# Patient Record
Sex: Male | Born: 1955
Health system: Southern US, Community
[De-identification: ages and names within clinical notes are randomized; demographics above are authoritative.]

## PROBLEM LIST (undated history)

## (undated) HISTORY — PX: VASECTOMY: SHX75

## (undated) HISTORY — PX: INGUINAL HERNIA REPAIR: SUR1180

---

## 2010-05-30 ENCOUNTER — Ambulatory Visit: Payer: Self-pay | Admitting: Family Medicine

## 2011-06-08 IMAGING — CR DG LUMBAR SPINE COMPLETE 4+V
1 series · 5 of 5 positions shown · non-contrast
Comparison: none

REASON FOR EXAM: back pain
COMMENTS:

[Series 1: view not recorded · 0.17mm/px · 5 of 5 slices shown]
[im 1/5]
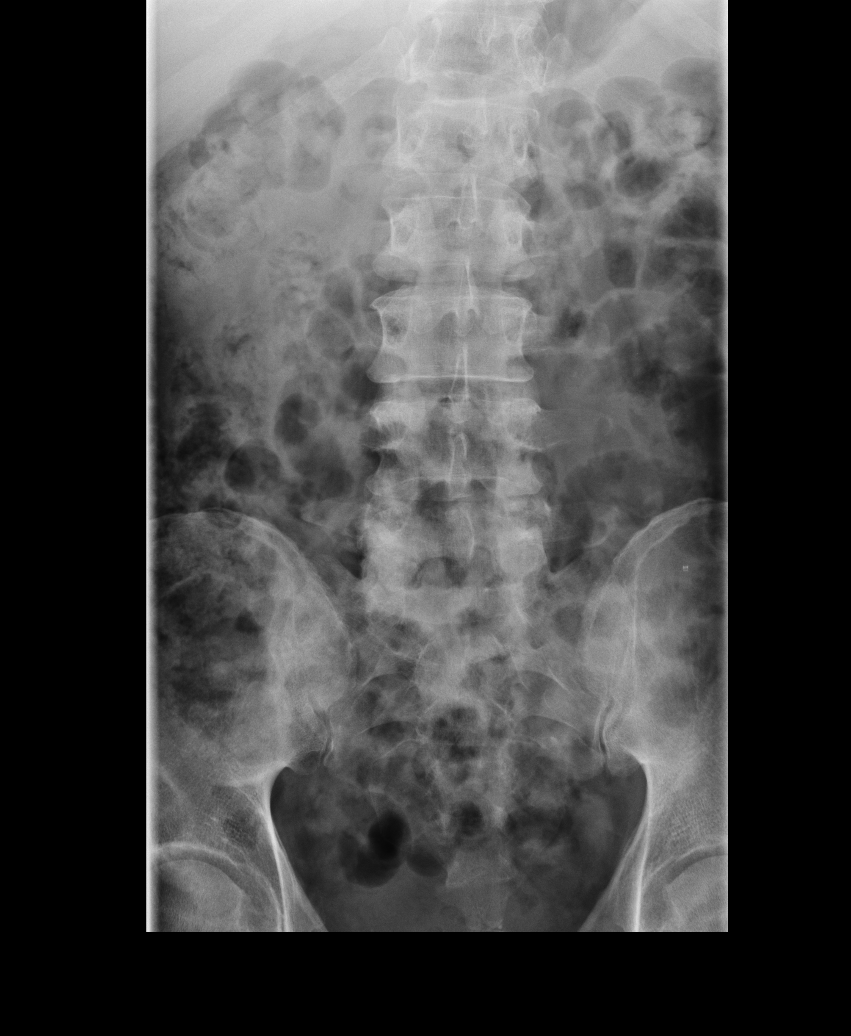
[im 2/5]
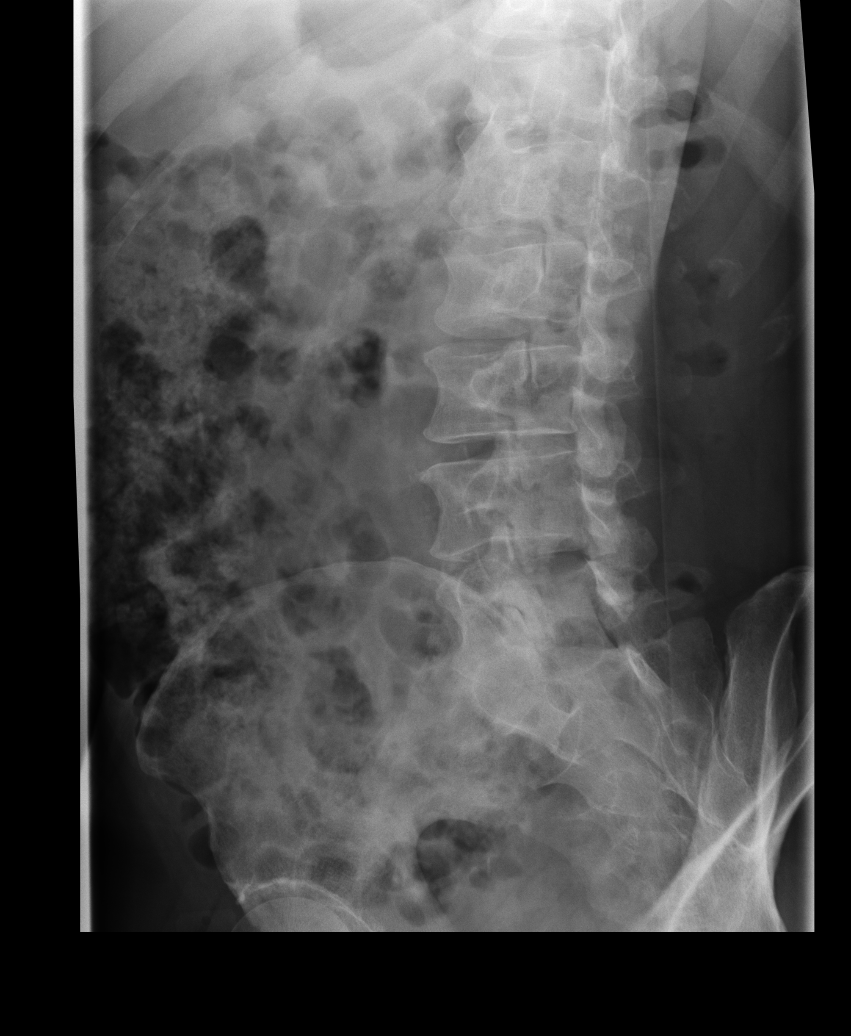
[im 3/5]
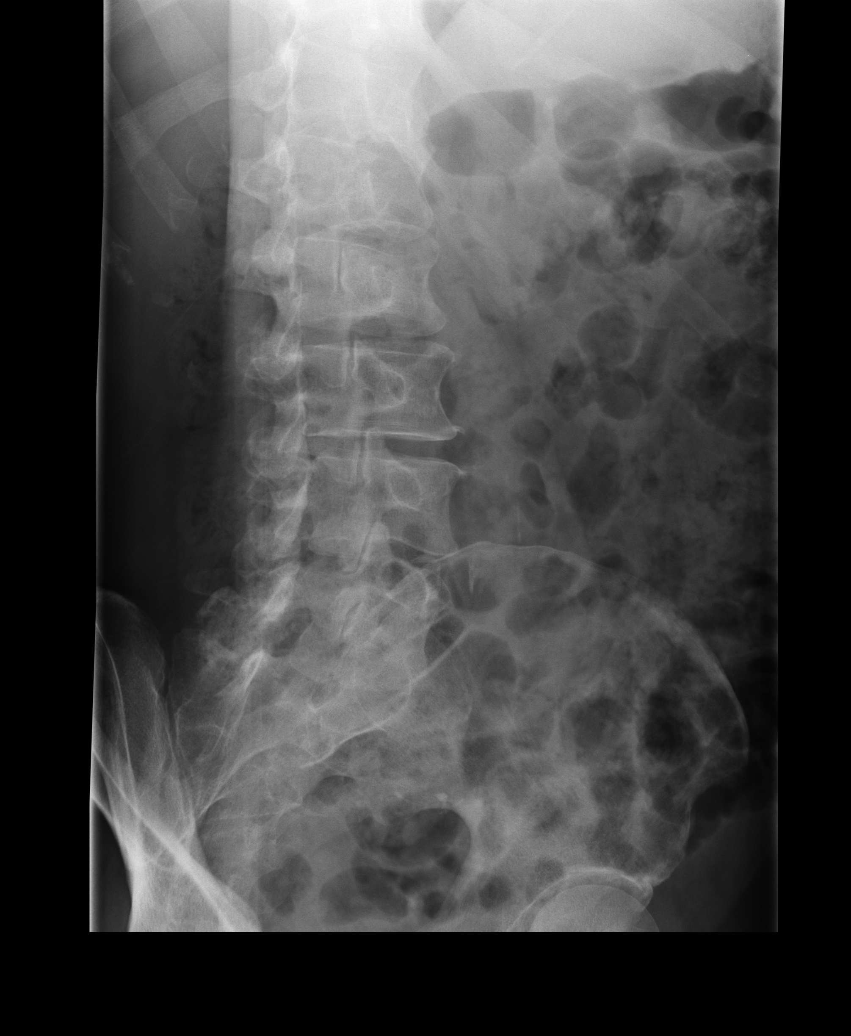
[im 4/5]
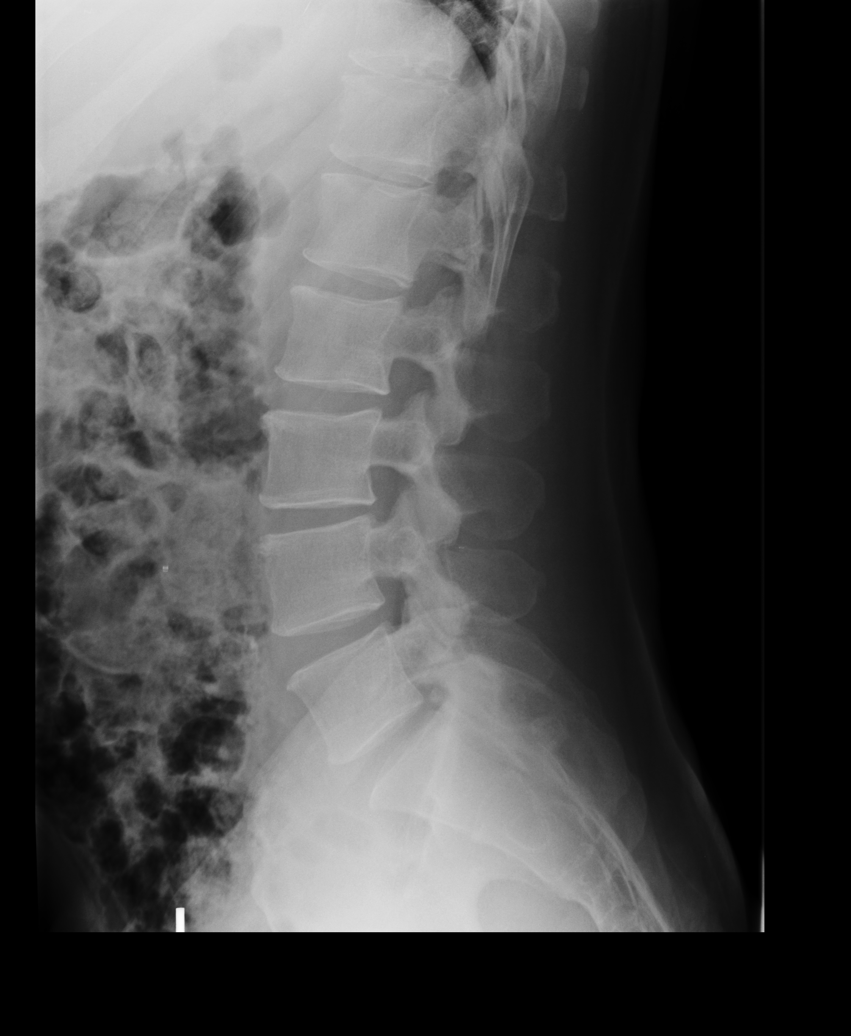
[im 5/5]
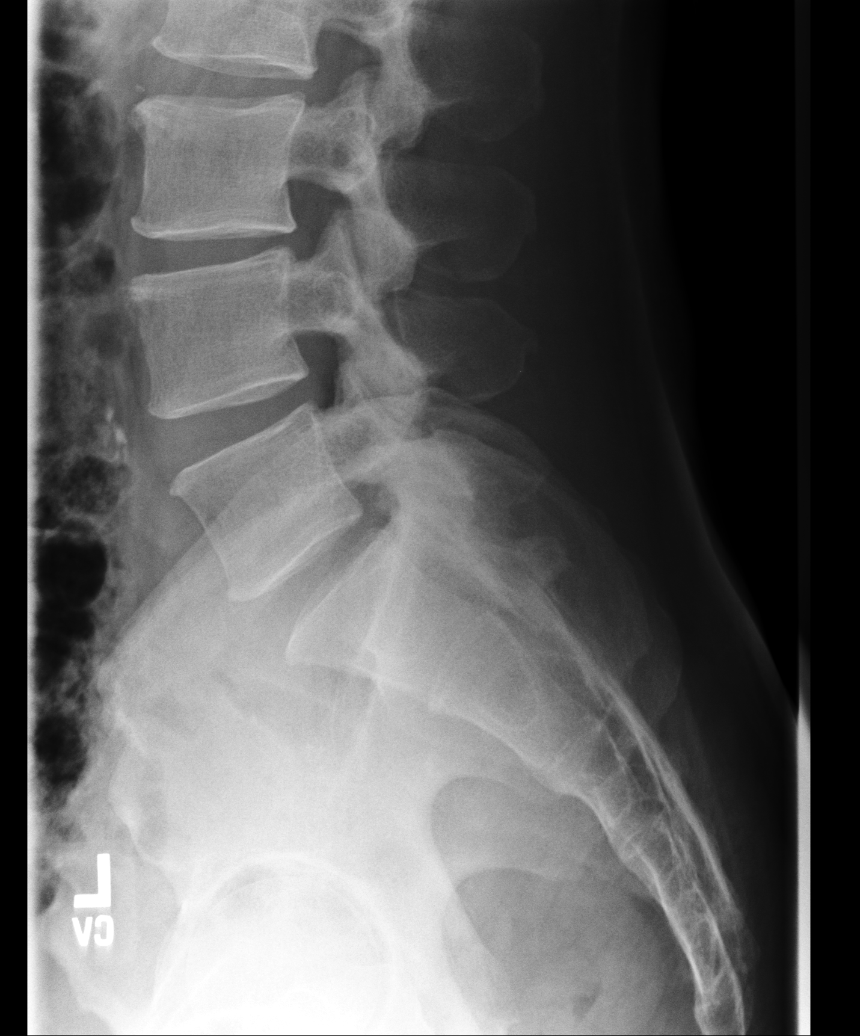

[5 of 5 positions shown; findings below may reference images not displayed]

PROCEDURE:     KDR - KDXR LUMBAR SPINE WITH OBLIQUES  - May 30, 2010  [DATE]

RESULT:     Images of the lumbar spine no demonstrate facet hypertrophy at
the L4-L5 and L5-S1 levels greater on the right than the left. The disc
spaces and vertebral body heights are maintained. There is no fracture or
subluxation. No bony destruction is evident.
IMPRESSION: Degenerative changes especially at L5-S1 on the right where
there is facet hypertrophy. Otherwise no significant finding is present. MRI
followup may be beneficial to evaluate for spinal canal stenosis or
foraminal stenosis.

## 2011-06-08 IMAGING — CR RIGHT HIP - COMPLETE 2+ VIEW
1 series · 2 of 2 positions shown · non-contrast
Comparison: none

REASON FOR EXAM: hip pain
COMMENTS:

PROCEDURE:     KDR - KDXR HIP RIGHT COMPLETE  - May 30, 2010  [DATE]
RESULT:     Images of the right hip demonstrate no evidence of fracture,
dislocation or radiopaque foreign body.

[Series 1: view not recorded · 0.17mm/px · 2 of 2 slices shown]
[im 1/2]
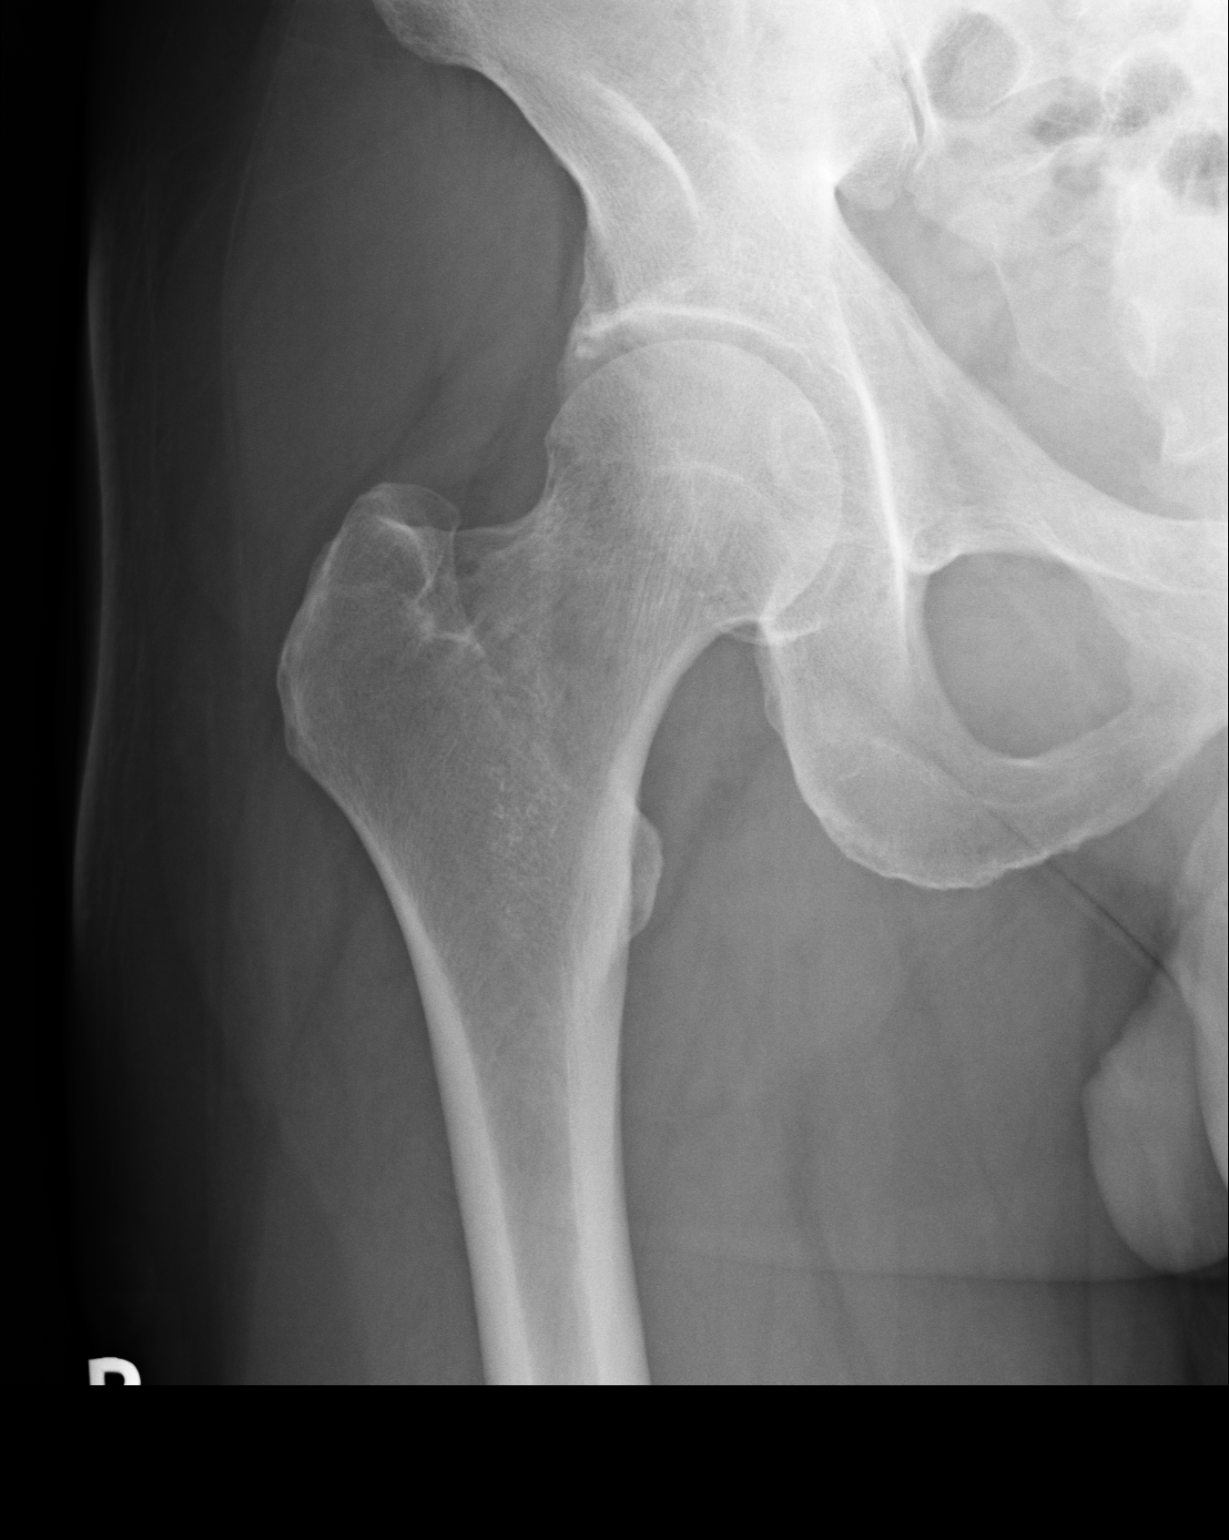
[im 2/2]
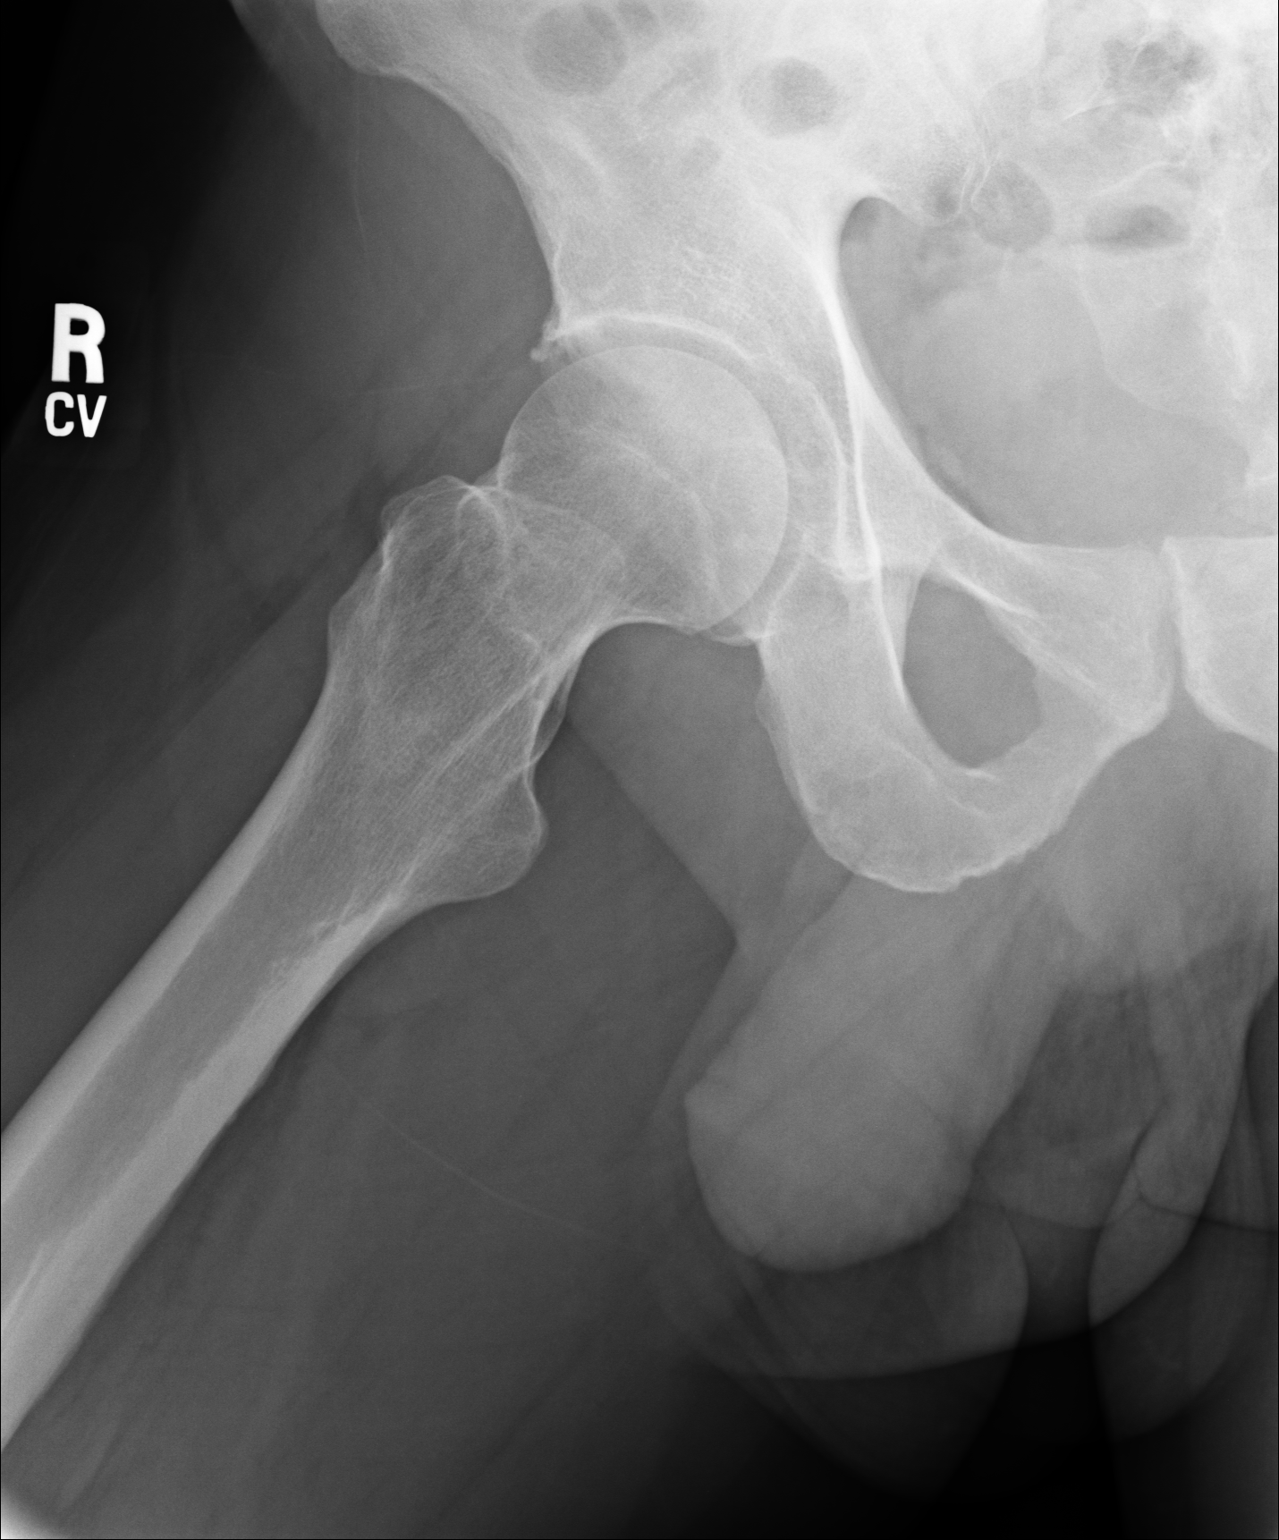

[2 of 2 positions shown; findings below may reference images not displayed]

IMPRESSION: No acute bony abnormality evident. If there is clinical
concern for occult fracture, followup with limited hip fracture protocol of
the right hip with MRI is suggested.

## 2012-05-05 ENCOUNTER — Other Ambulatory Visit: Payer: Self-pay | Admitting: Family Medicine

## 2012-05-31 ENCOUNTER — Other Ambulatory Visit: Payer: Self-pay | Admitting: Family Medicine

## 2012-05-31 NOTE — Telephone Encounter (Signed)
Please pull paper chart.  

## 2012-05-31 NOTE — Telephone Encounter (Signed)
Last seen in Jan 2007. Chart in storage.

## 2015-07-30 DIAGNOSIS — H40003 Preglaucoma, unspecified, bilateral: Secondary | ICD-10-CM | POA: Diagnosis not present

## 2016-02-11 DIAGNOSIS — H40053 Ocular hypertension, bilateral: Secondary | ICD-10-CM | POA: Diagnosis not present

## 2016-03-10 DIAGNOSIS — H409 Unspecified glaucoma: Secondary | ICD-10-CM | POA: Diagnosis not present

## 2016-03-10 DIAGNOSIS — I10 Essential (primary) hypertension: Secondary | ICD-10-CM | POA: Diagnosis not present

## 2016-03-10 DIAGNOSIS — E7800 Pure hypercholesterolemia, unspecified: Secondary | ICD-10-CM | POA: Diagnosis not present

## 2016-03-10 DIAGNOSIS — E663 Overweight: Secondary | ICD-10-CM | POA: Diagnosis not present

## 2016-05-16 DIAGNOSIS — D485 Neoplasm of uncertain behavior of skin: Secondary | ICD-10-CM | POA: Diagnosis not present

## 2016-05-16 DIAGNOSIS — L723 Sebaceous cyst: Secondary | ICD-10-CM | POA: Diagnosis not present

## 2016-05-16 DIAGNOSIS — L0889 Other specified local infections of the skin and subcutaneous tissue: Secondary | ICD-10-CM | POA: Diagnosis not present

## 2016-06-07 DIAGNOSIS — D229 Melanocytic nevi, unspecified: Secondary | ICD-10-CM | POA: Diagnosis not present

## 2016-06-07 DIAGNOSIS — D485 Neoplasm of uncertain behavior of skin: Secondary | ICD-10-CM | POA: Diagnosis not present

## 2016-07-19 DIAGNOSIS — K649 Unspecified hemorrhoids: Secondary | ICD-10-CM | POA: Diagnosis not present

## 2016-07-19 DIAGNOSIS — I10 Essential (primary) hypertension: Secondary | ICD-10-CM | POA: Diagnosis not present

## 2016-07-19 DIAGNOSIS — E7800 Pure hypercholesterolemia, unspecified: Secondary | ICD-10-CM | POA: Diagnosis not present

## 2016-07-19 DIAGNOSIS — R319 Hematuria, unspecified: Secondary | ICD-10-CM | POA: Diagnosis not present

## 2016-07-26 DIAGNOSIS — H409 Unspecified glaucoma: Secondary | ICD-10-CM | POA: Diagnosis not present

## 2016-07-26 DIAGNOSIS — E7800 Pure hypercholesterolemia, unspecified: Secondary | ICD-10-CM | POA: Diagnosis not present

## 2016-07-26 DIAGNOSIS — I10 Essential (primary) hypertension: Secondary | ICD-10-CM | POA: Diagnosis not present

## 2016-07-26 DIAGNOSIS — R319 Hematuria, unspecified: Secondary | ICD-10-CM | POA: Diagnosis not present

## 2016-07-26 DIAGNOSIS — Z1389 Encounter for screening for other disorder: Secondary | ICD-10-CM | POA: Diagnosis not present

## 2016-08-14 DIAGNOSIS — H40033 Anatomical narrow angle, bilateral: Secondary | ICD-10-CM | POA: Diagnosis not present

## 2016-08-14 DIAGNOSIS — H40053 Ocular hypertension, bilateral: Secondary | ICD-10-CM | POA: Diagnosis not present

## 2016-08-16 DIAGNOSIS — R310 Gross hematuria: Secondary | ICD-10-CM | POA: Diagnosis not present

## 2016-08-16 DIAGNOSIS — N401 Enlarged prostate with lower urinary tract symptoms: Secondary | ICD-10-CM | POA: Diagnosis not present

## 2016-08-25 DIAGNOSIS — N400 Enlarged prostate without lower urinary tract symptoms: Secondary | ICD-10-CM | POA: Diagnosis not present

## 2016-08-25 DIAGNOSIS — I700 Atherosclerosis of aorta: Secondary | ICD-10-CM | POA: Diagnosis not present

## 2016-08-25 DIAGNOSIS — R310 Gross hematuria: Secondary | ICD-10-CM | POA: Diagnosis not present

## 2016-08-30 DIAGNOSIS — N329 Bladder disorder, unspecified: Secondary | ICD-10-CM | POA: Diagnosis not present

## 2016-08-30 DIAGNOSIS — N401 Enlarged prostate with lower urinary tract symptoms: Secondary | ICD-10-CM | POA: Diagnosis not present

## 2016-08-30 DIAGNOSIS — R310 Gross hematuria: Secondary | ICD-10-CM | POA: Diagnosis not present

## 2016-10-20 DIAGNOSIS — I10 Essential (primary) hypertension: Secondary | ICD-10-CM | POA: Diagnosis not present

## 2016-10-20 DIAGNOSIS — H409 Unspecified glaucoma: Secondary | ICD-10-CM | POA: Diagnosis not present

## 2016-10-20 DIAGNOSIS — R319 Hematuria, unspecified: Secondary | ICD-10-CM | POA: Diagnosis not present

## 2016-10-20 DIAGNOSIS — E7800 Pure hypercholesterolemia, unspecified: Secondary | ICD-10-CM | POA: Diagnosis not present

## 2016-12-13 DIAGNOSIS — D485 Neoplasm of uncertain behavior of skin: Secondary | ICD-10-CM | POA: Diagnosis not present

## 2016-12-13 DIAGNOSIS — Z86018 Personal history of other benign neoplasm: Secondary | ICD-10-CM | POA: Diagnosis not present

## 2017-02-13 DIAGNOSIS — H40003 Preglaucoma, unspecified, bilateral: Secondary | ICD-10-CM | POA: Diagnosis not present

## 2017-03-19 DIAGNOSIS — R319 Hematuria, unspecified: Secondary | ICD-10-CM | POA: Diagnosis not present

## 2017-03-19 DIAGNOSIS — Z720 Tobacco use: Secondary | ICD-10-CM | POA: Diagnosis not present

## 2017-03-19 DIAGNOSIS — E7800 Pure hypercholesterolemia, unspecified: Secondary | ICD-10-CM | POA: Diagnosis not present

## 2017-03-19 DIAGNOSIS — I10 Essential (primary) hypertension: Secondary | ICD-10-CM | POA: Diagnosis not present

## 2017-05-01 DIAGNOSIS — E7800 Pure hypercholesterolemia, unspecified: Secondary | ICD-10-CM | POA: Diagnosis not present

## 2017-05-01 DIAGNOSIS — I10 Essential (primary) hypertension: Secondary | ICD-10-CM | POA: Diagnosis not present

## 2017-05-03 DIAGNOSIS — I10 Essential (primary) hypertension: Secondary | ICD-10-CM | POA: Diagnosis not present

## 2017-05-03 DIAGNOSIS — Z122 Encounter for screening for malignant neoplasm of respiratory organs: Secondary | ICD-10-CM | POA: Diagnosis not present

## 2017-05-03 DIAGNOSIS — Z0000 Encounter for general adult medical examination without abnormal findings: Secondary | ICD-10-CM | POA: Diagnosis not present

## 2017-05-03 DIAGNOSIS — Z720 Tobacco use: Secondary | ICD-10-CM | POA: Diagnosis not present

## 2017-05-03 DIAGNOSIS — Z23 Encounter for immunization: Secondary | ICD-10-CM | POA: Diagnosis not present

## 2017-06-21 DIAGNOSIS — D485 Neoplasm of uncertain behavior of skin: Secondary | ICD-10-CM | POA: Diagnosis not present

## 2017-06-21 DIAGNOSIS — Z86018 Personal history of other benign neoplasm: Secondary | ICD-10-CM | POA: Diagnosis not present

## 2017-06-21 DIAGNOSIS — L821 Other seborrheic keratosis: Secondary | ICD-10-CM | POA: Diagnosis not present

## 2017-06-21 DIAGNOSIS — L578 Other skin changes due to chronic exposure to nonionizing radiation: Secondary | ICD-10-CM | POA: Diagnosis not present

## 2017-07-17 DIAGNOSIS — D485 Neoplasm of uncertain behavior of skin: Secondary | ICD-10-CM | POA: Diagnosis not present

## 2017-07-17 DIAGNOSIS — D225 Melanocytic nevi of trunk: Secondary | ICD-10-CM | POA: Diagnosis not present

## 2017-08-16 DIAGNOSIS — H40053 Ocular hypertension, bilateral: Secondary | ICD-10-CM | POA: Diagnosis not present

## 2017-08-16 DIAGNOSIS — H02053 Trichiasis without entropian right eye, unspecified eyelid: Secondary | ICD-10-CM | POA: Diagnosis not present

## 2017-10-31 DIAGNOSIS — G4733 Obstructive sleep apnea (adult) (pediatric): Secondary | ICD-10-CM | POA: Diagnosis not present

## 2017-10-31 DIAGNOSIS — Z1331 Encounter for screening for depression: Secondary | ICD-10-CM | POA: Diagnosis not present

## 2017-10-31 DIAGNOSIS — I10 Essential (primary) hypertension: Secondary | ICD-10-CM | POA: Diagnosis not present

## 2017-10-31 DIAGNOSIS — R001 Bradycardia, unspecified: Secondary | ICD-10-CM | POA: Diagnosis not present

## 2017-11-05 DIAGNOSIS — G4730 Sleep apnea, unspecified: Secondary | ICD-10-CM | POA: Diagnosis not present

## 2017-12-19 DIAGNOSIS — L570 Actinic keratosis: Secondary | ICD-10-CM | POA: Diagnosis not present

## 2017-12-19 DIAGNOSIS — Z86018 Personal history of other benign neoplasm: Secondary | ICD-10-CM | POA: Diagnosis not present

## 2017-12-19 DIAGNOSIS — L578 Other skin changes due to chronic exposure to nonionizing radiation: Secondary | ICD-10-CM | POA: Diagnosis not present

## 2017-12-19 DIAGNOSIS — L720 Epidermal cyst: Secondary | ICD-10-CM | POA: Diagnosis not present

## 2017-12-19 DIAGNOSIS — Z1283 Encounter for screening for malignant neoplasm of skin: Secondary | ICD-10-CM | POA: Diagnosis not present

## 2018-02-12 DIAGNOSIS — H40003 Preglaucoma, unspecified, bilateral: Secondary | ICD-10-CM | POA: Diagnosis not present

## 2018-05-10 DIAGNOSIS — E7800 Pure hypercholesterolemia, unspecified: Secondary | ICD-10-CM | POA: Diagnosis not present

## 2018-05-10 DIAGNOSIS — Z6829 Body mass index (BMI) 29.0-29.9, adult: Secondary | ICD-10-CM | POA: Diagnosis not present

## 2018-05-10 DIAGNOSIS — R001 Bradycardia, unspecified: Secondary | ICD-10-CM | POA: Diagnosis not present

## 2018-05-10 DIAGNOSIS — I10 Essential (primary) hypertension: Secondary | ICD-10-CM | POA: Diagnosis not present

## 2018-11-28 DIAGNOSIS — R001 Bradycardia, unspecified: Secondary | ICD-10-CM | POA: Diagnosis not present

## 2018-11-28 DIAGNOSIS — T50905A Adverse effect of unspecified drugs, medicaments and biological substances, initial encounter: Secondary | ICD-10-CM | POA: Diagnosis not present

## 2018-11-28 DIAGNOSIS — E7800 Pure hypercholesterolemia, unspecified: Secondary | ICD-10-CM | POA: Diagnosis not present

## 2018-11-28 DIAGNOSIS — Z1331 Encounter for screening for depression: Secondary | ICD-10-CM | POA: Diagnosis not present

## 2018-11-28 DIAGNOSIS — I10 Essential (primary) hypertension: Secondary | ICD-10-CM | POA: Diagnosis not present

## 2018-12-23 DIAGNOSIS — L578 Other skin changes due to chronic exposure to nonionizing radiation: Secondary | ICD-10-CM | POA: Diagnosis not present

## 2018-12-23 DIAGNOSIS — Z86018 Personal history of other benign neoplasm: Secondary | ICD-10-CM | POA: Diagnosis not present

## 2018-12-23 DIAGNOSIS — Z872 Personal history of diseases of the skin and subcutaneous tissue: Secondary | ICD-10-CM | POA: Diagnosis not present

## 2019-03-03 DIAGNOSIS — K4090 Unilateral inguinal hernia, without obstruction or gangrene, not specified as recurrent: Secondary | ICD-10-CM | POA: Diagnosis not present

## 2019-03-03 DIAGNOSIS — Z6827 Body mass index (BMI) 27.0-27.9, adult: Secondary | ICD-10-CM | POA: Diagnosis not present

## 2019-03-13 DIAGNOSIS — K4090 Unilateral inguinal hernia, without obstruction or gangrene, not specified as recurrent: Secondary | ICD-10-CM | POA: Diagnosis not present

## 2019-03-13 DIAGNOSIS — N5089 Other specified disorders of the male genital organs: Secondary | ICD-10-CM | POA: Diagnosis not present

## 2019-03-28 DIAGNOSIS — Z01818 Encounter for other preprocedural examination: Secondary | ICD-10-CM | POA: Diagnosis not present

## 2019-03-31 DIAGNOSIS — Z9989 Dependence on other enabling machines and devices: Secondary | ICD-10-CM | POA: Diagnosis not present

## 2019-03-31 DIAGNOSIS — J302 Other seasonal allergic rhinitis: Secondary | ICD-10-CM | POA: Diagnosis not present

## 2019-03-31 DIAGNOSIS — G4733 Obstructive sleep apnea (adult) (pediatric): Secondary | ICD-10-CM | POA: Diagnosis not present

## 2019-03-31 DIAGNOSIS — K4090 Unilateral inguinal hernia, without obstruction or gangrene, not specified as recurrent: Secondary | ICD-10-CM | POA: Diagnosis not present

## 2019-03-31 DIAGNOSIS — K219 Gastro-esophageal reflux disease without esophagitis: Secondary | ICD-10-CM | POA: Diagnosis not present

## 2019-03-31 DIAGNOSIS — F17210 Nicotine dependence, cigarettes, uncomplicated: Secondary | ICD-10-CM | POA: Diagnosis not present

## 2019-03-31 DIAGNOSIS — R918 Other nonspecific abnormal finding of lung field: Secondary | ICD-10-CM | POA: Diagnosis not present

## 2019-03-31 DIAGNOSIS — Z79899 Other long term (current) drug therapy: Secondary | ICD-10-CM | POA: Diagnosis not present

## 2019-03-31 DIAGNOSIS — I10 Essential (primary) hypertension: Secondary | ICD-10-CM | POA: Diagnosis not present

## 2019-04-07 DIAGNOSIS — J189 Pneumonia, unspecified organism: Secondary | ICD-10-CM | POA: Diagnosis not present

## 2019-04-07 DIAGNOSIS — Z87891 Personal history of nicotine dependence: Secondary | ICD-10-CM | POA: Diagnosis not present

## 2019-04-07 DIAGNOSIS — J449 Chronic obstructive pulmonary disease, unspecified: Secondary | ICD-10-CM | POA: Diagnosis not present

## 2020-08-17 DIAGNOSIS — H40003 Preglaucoma, unspecified, bilateral: Secondary | ICD-10-CM | POA: Diagnosis not present

## 2020-10-08 DIAGNOSIS — Z0000 Encounter for general adult medical examination without abnormal findings: Secondary | ICD-10-CM | POA: Diagnosis not present

## 2020-10-08 DIAGNOSIS — I10 Essential (primary) hypertension: Secondary | ICD-10-CM | POA: Diagnosis not present

## 2020-10-08 DIAGNOSIS — R011 Cardiac murmur, unspecified: Secondary | ICD-10-CM | POA: Diagnosis not present

## 2020-10-08 DIAGNOSIS — Z720 Tobacco use: Secondary | ICD-10-CM | POA: Diagnosis not present

## 2020-10-08 DIAGNOSIS — E7800 Pure hypercholesterolemia, unspecified: Secondary | ICD-10-CM | POA: Diagnosis not present

## 2020-10-08 DIAGNOSIS — Z136 Encounter for screening for cardiovascular disorders: Secondary | ICD-10-CM | POA: Diagnosis not present

## 2020-10-08 DIAGNOSIS — R319 Hematuria, unspecified: Secondary | ICD-10-CM | POA: Diagnosis not present

## 2020-10-08 DIAGNOSIS — R001 Bradycardia, unspecified: Secondary | ICD-10-CM | POA: Diagnosis not present

## 2020-10-08 DIAGNOSIS — Z23 Encounter for immunization: Secondary | ICD-10-CM | POA: Diagnosis not present

## 2020-10-08 DIAGNOSIS — Z9181 History of falling: Secondary | ICD-10-CM | POA: Diagnosis not present

## 2020-10-21 ENCOUNTER — Ambulatory Visit (INDEPENDENT_AMBULATORY_CARE_PROVIDER_SITE_OTHER): Payer: Medicare Other | Admitting: Physician Assistant

## 2020-10-21 ENCOUNTER — Encounter: Payer: Self-pay | Admitting: Urology

## 2020-10-21 ENCOUNTER — Other Ambulatory Visit: Payer: Self-pay

## 2020-10-21 VITALS — BP 152/84 | HR 70 | Ht 68.0 in | Wt 177.0 lb

## 2020-10-21 DIAGNOSIS — R310 Gross hematuria: Secondary | ICD-10-CM

## 2020-10-21 DIAGNOSIS — R972 Elevated prostate specific antigen [PSA]: Secondary | ICD-10-CM

## 2020-10-21 DIAGNOSIS — R3916 Straining to void: Secondary | ICD-10-CM

## 2020-10-21 DIAGNOSIS — N401 Enlarged prostate with lower urinary tract symptoms: Secondary | ICD-10-CM | POA: Diagnosis not present

## 2020-10-21 MED ORDER — TAMSULOSIN HCL 0.4 MG PO CAPS: 0.4000 mg | ORAL_CAPSULE | Freq: Every day | ORAL | 0 refills | Status: DC

## 2020-10-21 NOTE — Patient Instructions (Signed)
Cystoscopy Cystoscopy is a procedure that is used to help diagnose and sometimes treat conditions that affect the lower urinary tract. The lower urinary tract includes the bladder and the urethra. The urethra is the tube that drains urine from the bladder. Cystoscopy is done using a thin, tube-shaped instrument with a light and camera at the end (cystoscope). The cystoscope may be hard or flexible, depending on the goal of the procedure. The cystoscope is inserted through the urethra, into the bladder. Cystoscopy may be recommended if you have: Urinary tract infections that keep coming back. Blood in the urine (hematuria). An inability to control when you urinate (urinary incontinence) or an overactive bladder. Unusual cells found in a urine sample. A blockage in the urethra, such as a urinary stone. Painful urination. An abnormality in the bladder found during an intravenous pyelogram (IVP) or CT scan. Cystoscopy may also be done to remove a sample of tissue to be examined under a microscope (biopsy). What are the risks? Generally, this is a safe procedure. However, problems may occur, including: Infection. Bleeding.  What happens during the procedure?  You will be given one or more of the following: A medicine to numb the area (local anesthetic). The area around the opening of your urethra will be cleaned. The cystoscope will be passed through your urethra into your bladder. Germ-free (sterile) fluid will flow through the cystoscope to fill your bladder. The fluid will stretch your bladder so that your health care provider can clearly examine your bladder walls. Your doctor will look at the urethra and bladder. The cystoscope will be removed The procedure may vary among health care providers  What can I expect after the procedure? After the procedure, it is common to have: Some soreness or pain in your abdomen and urethra. Urinary symptoms. These include: Mild pain or burning when you  urinate. Pain should stop within a few minutes after you urinate. This may last for up to 1 week. A small amount of blood in your urine for several days. Feeling like you need to urinate but producing only a small amount of urine. Follow these instructions at home: General instructions Return to your normal activities as told by your health care provider.  Do not drive for 24 hours if you were given a sedative during your procedure. Watch for any blood in your urine. If the amount of blood in your urine increases, call your health care provider. If a tissue sample was removed for testing (biopsy) during your procedure, it is up to you to get your test results. Ask your health care provider, or the department that is doing the test, when your results will be ready. Drink enough fluid to keep your urine pale yellow. Keep all follow-up visits as told by your health care provider. This is important. Contact a health care provider if you: Have pain that gets worse or does not get better with medicine, especially pain when you urinate. Have trouble urinating. Have more blood in your urine. Get help right away if you: Have blood clots in your urine. Have abdominal pain. Have a fever or chills. Are unable to urinate. Summary Cystoscopy is a procedure that is used to help diagnose and sometimes treat conditions that affect the lower urinary tract. Cystoscopy is done using a thin, tube-shaped instrument with a light and camera at the end. After the procedure, it is common to have some soreness or pain in your abdomen and urethra. Watch for any blood in your urine.   If the amount of blood in your urine increases, call your health care provider. If you were prescribed an antibiotic medicine, take it as told by your health care provider. Do not stop taking the antibiotic even if you start to feel better. This information is not intended to replace advice given to you by your health care provider. Make  sure you discuss any questions you have with your health care provider. Document Revised: 04/02/2018 Document Reviewed: 04/02/2018 Elsevier Patient Education  2020 Elsevier Inc.  

## 2020-10-21 NOTE — Progress Notes (Signed)
10/21/2020 3:09 PM   Otilio Saber 1955/12/22 [id number]  CC: Chief Complaint  Patient presents with   Hematuria   HPI: Michael Strickland is a 65 y.o. male who presents today for evaluation of gross hematuria.   Today he reports gross hematuria that started approximately 2 weeks ago and was associated with the passage of a clump and a fleck in his urine.  Gross hematuria has improved since passage of these materials, and he denies pain associated with this episode.  Additionally, he reports straining with urination at baseline as well as some urinary urgency and nocturia x3.  He denies intermittency and states he feels he empties well.  He states his PCP got a PSA on him recently and thinks the value may have been 5.  On review of his referral paperwork, it mentions a PSA of 2 in 2018.  He states he had a similar episode of gross hematuria around 2015, for which he saw Dr. Shanna Cisco at [facility] in [place].  He states he underwent a CT scan and cystoscopy at that time, but he does not recall the results.  Unfortunately, results are not available per chart review.  He denies a history of nephrolithiasis and it has an approximate 50-pack-year smoking history.  He has a worked history of exposure to industrial metals and chemicals including arsenic.  In-office UA today positive for 2+ blood; urine microscopy with 3-10 RBCs/HPF.  PMH: History reviewed. No pertinent past medical history.  Surgical History: Past Surgical History:  Procedure Laterality Date   INGUINAL HERNIA REPAIR Right    VASECTOMY      Home Medications:  Allergies as of 10/21/2020   No Known Allergies      Medication List    as of October 21, 2020  3:09 PM   You have not been prescribed any medications.    Allergies:  No Known Allergies  Family History: History reviewed. No pertinent family history.  Social History:   reports that he has been smoking cigarettes. He has never used smokeless  tobacco. He reports current alcohol use. No history on file for drug use.  Physical Exam: BP (!) 152/84   Pulse 70   Ht '5\' 8"'  (1.727 m)   Wt 177 lb (80.3 kg)   BMI 26.91 kg/m   Constitutional:  Alert and oriented, no acute distress, nontoxic appearing HEENT: [place], AT Cardiovascular: No clubbing, cyanosis, or edema Respiratory: Normal respiratory effort, no increased work of breathing GU: External hemorrhoids, normal sphincter tone.  Smooth, slightly asymmetrically enlarged L>R 50+cc prostate without nodules or induration. Skin: No rashes, bruises or suspicious lesions Neurologic: Grossly intact, no focal deficits, moving all 4 extremities Psychiatric: Normal mood and affect  Laboratory Data: Results for orders placed or performed in visit on 10/21/20  Microscopic Examination   Urine  Result Value Ref Range   WBC, UA 0-5 0 - 5 /hpf   RBC 3-10 (A) 0 - 2 /hpf   Epithelial Cells (non renal) None seen 0 - 10 /hpf   Bacteria, UA None seen None seen/Few  Urinalysis, Complete  Result Value Ref Range   Specific Gravity, UA 1.025 1.005 - 1.030   pH, UA 5.5 5.0 - 7.5   Color, UA Yellow Yellow   Appearance Ur Clear Clear   Leukocytes,UA Negative Negative   Protein,UA Negative Negative/Trace   Glucose, UA Negative Negative   Ketones, UA Negative Negative   RBC, UA 2+ (A) Negative   Bilirubin, UA Negative Negative  Urobilinogen, Ur 0.2 0.2 - 1.0 mg/dL   Nitrite, UA Negative Negative   Microscopic Examination See below:   Basic metabolic panel  Result Value Ref Range   Glucose 94 65 - 99 mg/dL   BUN 15 8 - 27 mg/dL   Creatinine, Ser 0.96 0.76 - 1.27 mg/dL   eGFR 88 >59 mL/min/1.73   BUN/Creatinine Ratio 16 10 - 24   Sodium 142 134 - 144 mmol/L   Potassium 4.4 3.5 - 5.2 mmol/L   Chloride 104 96 - 106 mmol/L   CO2 24 20 - 29 mmol/L   Calcium 9.1 8.6 - 10.2 mg/dL    Assessment & Plan:   1. Gross hematuria Possibly associated with a passed stone, however he does not have a known  history of urolithiasis and denies pain.  Recommend repeating hematuria work-up at this time.  Obtained BMP today to check renal function given plans for IV contrast.  I had a lengthy conversation today with the patient regarding the etiology of blood in the urine.  I explained that blood in the urine can be caused by a myriad of factors, including but not limited to infection, stones, cysts, anticoagulation, and urinary tract malignancies.  I explained that the recommended work-up for blood in the urine is twofold and includes a CT urogram for evaluation of the upper urinary tract including kidneys and ureters as well as a cystoscopy for evaluation of the urethra and bladder.  I explained that these two studies complement one another in reviewing the entire urinary tract for possible causes of bleeding.  I recommended that we proceed with this at this time.  Patient agreed; CTU ordered and follow-up cysto with CTU results scheduled. - Urinalysis, Complete - CT HEMATURIA WORKUP; Future - Basic metabolic panel  2. Benign prostatic hyperplasia (BPH) with straining on urination BPH on physical exam, symptomatic per patient report.  We will start Flomax.  Counseled him about side effects including orthostasis and counseled him to contact the office if he develops these, we will try silodosin in that case. - tamsulosin (FLOMAX) 0.4 MG CAPS capsule; Take 1 capsule (0.4 mg total) by mouth daily.  Dispense: 30 capsule; Refill: 0  3. Elevated PSA Possibly elevated PSA history per patient.  We requested lab records from his PCP for further evaluation.  Return in about 4 weeks (around 11/18/2020) for Cysto, CTU result, IPSS, and PVR with Dr. Bernardo Heater.  Debroah Loop, PA-C  [facility] [address] [place], [place] [address] [phone]

## 2020-10-22 ENCOUNTER — Telehealth: Payer: Self-pay

## 2020-10-22 LAB — URINALYSIS, COMPLETE
Bilirubin, UA: NEGATIVE
Glucose, UA: NEGATIVE
Ketones, UA: NEGATIVE
Leukocytes,UA: NEGATIVE
Nitrite, UA: NEGATIVE
Protein,UA: NEGATIVE
Specific Gravity, UA: 1.025 (ref 1.005–1.030)
Urobilinogen, Ur: 0.2 mg/dL (ref 0.2–1.0)
pH, UA: 5.5 (ref 5.0–7.5)

## 2020-10-22 LAB — MICROSCOPIC EXAMINATION
Bacteria, UA: NONE SEEN
Epithelial Cells (non renal): NONE SEEN /hpf (ref 0–10)

## 2020-10-22 LAB — BASIC METABOLIC PANEL
BUN/Creatinine Ratio: 16 (ref 10–24)
BUN: 15 mg/dL (ref 8–27)
CO2: 24 mmol/L (ref 20–29)
Calcium: 9.1 mg/dL (ref 8.6–10.2)
Chloride: 104 mmol/L (ref 96–106)
Creatinine, Ser: 0.96 mg/dL (ref 0.76–1.27)
Glucose: 94 mg/dL (ref 65–99)
Potassium: 4.4 mmol/L (ref 3.5–5.2)
Sodium: 142 mmol/L (ref 134–144)
eGFR: 88 mL/min/{1.73_m2} (ref >59)

## 2020-10-22 NOTE — Telephone Encounter (Signed)
Called pt primary care office. [facility] [phone]. I spoke with becky at the office and she will fax over medical records.

## 2020-11-09 ENCOUNTER — Other Ambulatory Visit: Payer: Self-pay

## 2020-11-09 ENCOUNTER — Ambulatory Visit
Admission: RE | Admit: 2020-11-09 | Discharge: 2020-11-09 | Disposition: A | Discharge status: Home / Self Care | Payer: Medicare Other | Source: Ambulatory Visit | Attending: Physician Assistant | Admitting: Physician Assistant

## 2020-11-09 DIAGNOSIS — R310 Gross hematuria: Secondary | ICD-10-CM | POA: Insufficient documentation

## 2020-11-09 DIAGNOSIS — N3289 Other specified disorders of bladder: Secondary | ICD-10-CM | POA: Diagnosis not present

## 2020-11-09 DIAGNOSIS — K769 Liver disease, unspecified: Secondary | ICD-10-CM | POA: Diagnosis not present

## 2020-11-09 MED ORDER — IOHEXOL 350 MG/ML SOLN
100.0000 mL | Freq: Once | INTRAVENOUS | Status: AC | PRN
  Administered 2020-11-09: 100 mL via INTRAVENOUS

## 2020-11-19 ENCOUNTER — Other Ambulatory Visit: Payer: Self-pay

## 2020-11-19 ENCOUNTER — Ambulatory Visit: Payer: Medicare Other | Admitting: Urology

## 2020-11-19 ENCOUNTER — Encounter: Payer: Self-pay | Admitting: Urology

## 2020-11-19 VITALS — BP 130/79 | HR 67 | Ht 68.0 in | Wt 177.0 lb

## 2020-11-19 DIAGNOSIS — R310 Gross hematuria: Secondary | ICD-10-CM | POA: Diagnosis not present

## 2020-11-19 LAB — MICROSCOPIC EXAMINATION: Bacteria, UA: NONE SEEN

## 2020-11-19 LAB — URINALYSIS, COMPLETE
Bilirubin, UA: NEGATIVE
Glucose, UA: NEGATIVE
Leukocytes,UA: NEGATIVE
Nitrite, UA: NEGATIVE
Protein,UA: NEGATIVE
Specific Gravity, UA: 1.015 (ref 1.005–1.030)
Urobilinogen, Ur: 0.2 mg/dL (ref 0.2–1.0)
pH, UA: 5.5 (ref 5.0–7.5)

## 2020-11-19 NOTE — Progress Notes (Signed)
   11/19/20  CC:  Chief Complaint  Patient presents with   Cysto    HPI: Refer to Sam Vaillancourt's note 10/21/2020.  Denies recurrent gross hematuria.  CTU showed prostate enlargement with a focal area of nodular asymmetric enhancement in the left gland extending to the left seminal vesicle  UA today 11-30 RBC  Blood pressure 130/79, pulse 67, height '5\' 8"'$  (1.727 m), weight 177 lb (80.3 kg). NED. A&Ox3.   No respiratory distress   Abd soft, NT, ND Normal phallus with bilateral descended testicles  Cystoscopy Procedure Note  Patient identification was confirmed, informed consent was obtained, and patient was prepped using Betadine solution.  Lidocaine jelly was administered per urethral meatus.     Pre-Procedure: - Inspection reveals a normal caliber urethral meatus.  Procedure: The flexible cystoscope was introduced without difficulty - No urethral strictures/lesions are present. -Prominent lateral lobe enlargement prostate with hypervascularity -Mild elevation bladder neck - Bilateral ureteral orifices identified - Bladder mucosa  reveals no ulcers, tumors, or lesions - No bladder stones - Mild trabeculation  Retroflexion shows no intravesical median lobe   Post-Procedure: - Patient tolerated the procedure well  Assessment/ Plan: High risk hematuria No upper tract abnormalities on CTU No bladder mucosal abnormalities on cystoscopy BPH with hypervascularity which is the most likely etiology of his hematuria Urine cytology ordered Follow-up 6 months for repeat UA/symptom check I do not see a PSA on record review.  Prostate CT findings most likely due to BPH however patient will schedule a PSA at least 1 month after his cystoscopy   Abbie Sons, MD

## 2020-11-21 ENCOUNTER — Encounter: Payer: Self-pay | Admitting: Urology

## 2020-11-22 ENCOUNTER — Other Ambulatory Visit: Payer: Self-pay | Admitting: Physician Assistant

## 2020-11-22 DIAGNOSIS — N401 Enlarged prostate with lower urinary tract symptoms: Secondary | ICD-10-CM

## 2020-11-22 DIAGNOSIS — R3916 Straining to void: Secondary | ICD-10-CM

## 2020-11-24 LAB — CYTOLOGY - NON PAP

## 2020-11-25 ENCOUNTER — Telehealth: Payer: Self-pay | Admitting: Urology

## 2020-11-25 DIAGNOSIS — R935 Abnormal findings on diagnostic imaging of other abdominal regions, including retroperitoneum: Secondary | ICD-10-CM

## 2020-11-25 DIAGNOSIS — R972 Elevated prostate specific antigen [PSA]: Secondary | ICD-10-CM

## 2020-11-25 NOTE — Telephone Encounter (Signed)
Urine cytology showed no cells suspicious for bladder cancer.  Chart was reviewed and no prior PSA level found.  Does he desire to have a PSA drawn for prostate cancer screening?

## 2020-11-25 NOTE — Telephone Encounter (Signed)
LMOM for patient to return call.

## 2020-11-29 NOTE — Telephone Encounter (Signed)
2nd attempt.     LMOM for patient to return call.

## 2020-11-29 NOTE — Telephone Encounter (Signed)
Patient returned call. He states he had a psa at his PCP ( scanned in chart) PSA was 4.9. he wants to know what the next step is

## 2020-11-30 NOTE — Telephone Encounter (Signed)
Patient notified and is going to wait on the call from imaging for the MRI.

## 2020-11-30 NOTE — Addendum Note (Signed)
Addended by: John Giovanni C on: 11/30/2020 12:51 PM   Modules accepted: Orders

## 2020-11-30 NOTE — Telephone Encounter (Signed)
LMOM for patient to return call.

## 2020-11-30 NOTE — Telephone Encounter (Signed)
Prostate findings on CT are nonspecific however MRI can show areas suspicious for prostate cancer.  Recommend scheduling a prostate MRI.  Order was entered and will call with results.  The other option would be to schedule a prostate biopsy

## 2020-12-17 ENCOUNTER — Ambulatory Visit: Admission: RE | Admit: 2020-12-17 | Payer: Medicare Other | Source: Ambulatory Visit

## 2020-12-17 ENCOUNTER — Other Ambulatory Visit: Payer: Self-pay

## 2020-12-17 ENCOUNTER — Ambulatory Visit
Admission: RE | Admit: 2020-12-17 | Discharge: 2020-12-17 | Disposition: A | Discharge status: Home / Self Care | Payer: Medicare Other | Source: Ambulatory Visit | Attending: Urology | Admitting: Urology

## 2020-12-17 DIAGNOSIS — Z5309 Procedure and treatment not carried out because of other contraindication: Secondary | ICD-10-CM

## 2020-12-17 DIAGNOSIS — R310 Gross hematuria: Secondary | ICD-10-CM | POA: Insufficient documentation

## 2020-12-17 DIAGNOSIS — R972 Elevated prostate specific antigen [PSA]: Secondary | ICD-10-CM | POA: Diagnosis present

## 2020-12-17 DIAGNOSIS — Z01818 Encounter for other preprocedural examination: Secondary | ICD-10-CM | POA: Diagnosis not present

## 2020-12-17 DIAGNOSIS — R590 Localized enlarged lymph nodes: Secondary | ICD-10-CM | POA: Diagnosis not present

## 2020-12-17 DIAGNOSIS — R935 Abnormal findings on diagnostic imaging of other abdominal regions, including retroperitoneum: Secondary | ICD-10-CM | POA: Diagnosis not present

## 2020-12-17 MED ORDER — GADOBUTROL 1 MMOL/ML IV SOLN
8.0000 mL | Freq: Once | INTRAVENOUS | Status: AC | PRN
  Administered 2020-12-17: 8 mL via INTRAVENOUS

## 2020-12-23 ENCOUNTER — Telehealth: Payer: Self-pay | Admitting: Urology

## 2020-12-23 NOTE — Telephone Encounter (Signed)
Patient called the office today requesting results of his MRI.  Please advise.

## 2020-12-23 NOTE — Telephone Encounter (Signed)
Called patient, left message on voicemail to call back

## 2020-12-24 NOTE — Telephone Encounter (Signed)
Patient called the office and left a voice mail message that he is sorry that he missed your call.  He would appreciate a call back before the long weekend, if possible.

## 2020-12-26 NOTE — Telephone Encounter (Signed)
I contacted Michael Strickland 12/25/2018 and discussed prostate MRI results.  Recommended prostate biopsy

## 2020-12-29 NOTE — Telephone Encounter (Signed)
Instruction reviewed  

## 2020-12-30 ENCOUNTER — Other Ambulatory Visit: Payer: Self-pay | Admitting: Physician Assistant

## 2020-12-30 ENCOUNTER — Telehealth: Payer: Self-pay

## 2020-12-30 DIAGNOSIS — Z122 Encounter for screening for malignant neoplasm of respiratory organs: Secondary | ICD-10-CM | POA: Diagnosis not present

## 2020-12-30 DIAGNOSIS — Z136 Encounter for screening for cardiovascular disorders: Secondary | ICD-10-CM | POA: Diagnosis not present

## 2020-12-30 DIAGNOSIS — R3916 Straining to void: Secondary | ICD-10-CM

## 2020-12-30 DIAGNOSIS — N401 Enlarged prostate with lower urinary tract symptoms: Secondary | ICD-10-CM

## 2020-12-30 DIAGNOSIS — Z87891 Personal history of nicotine dependence: Secondary | ICD-10-CM | POA: Diagnosis not present

## 2020-12-30 DIAGNOSIS — F17210 Nicotine dependence, cigarettes, uncomplicated: Secondary | ICD-10-CM | POA: Diagnosis not present

## 2020-12-30 NOTE — Telephone Encounter (Signed)
Pt LM on triage line questioning if he needs to d/c Flomax prior to prostate biopsy.   Called pt back, no answer. LM informing pt that he does not need to stop flomax.

## 2020-12-31 ENCOUNTER — Other Ambulatory Visit: Payer: Self-pay | Admitting: Physician Assistant

## 2020-12-31 DIAGNOSIS — N401 Enlarged prostate with lower urinary tract symptoms: Secondary | ICD-10-CM

## 2020-12-31 DIAGNOSIS — R3916 Straining to void: Secondary | ICD-10-CM

## 2021-01-10 DIAGNOSIS — Z23 Encounter for immunization: Secondary | ICD-10-CM | POA: Diagnosis not present

## 2021-01-17 DIAGNOSIS — L578 Other skin changes due to chronic exposure to nonionizing radiation: Secondary | ICD-10-CM | POA: Diagnosis not present

## 2021-01-17 DIAGNOSIS — Z86018 Personal history of other benign neoplasm: Secondary | ICD-10-CM | POA: Diagnosis not present

## 2021-01-17 DIAGNOSIS — D1801 Hemangioma of skin and subcutaneous tissue: Secondary | ICD-10-CM | POA: Diagnosis not present

## 2021-01-19 ENCOUNTER — Ambulatory Visit: Payer: Medicare Other | Admitting: Urology

## 2021-01-20 ENCOUNTER — Encounter: Payer: Self-pay | Admitting: Urology

## 2021-01-20 ENCOUNTER — Other Ambulatory Visit: Payer: Self-pay

## 2021-01-20 ENCOUNTER — Ambulatory Visit: Payer: Medicare Other | Admitting: Urology

## 2021-01-20 VITALS — BP 127/87 | HR 78 | Ht 68.0 in | Wt 172.0 lb

## 2021-01-20 DIAGNOSIS — R972 Elevated prostate specific antigen [PSA]: Secondary | ICD-10-CM | POA: Diagnosis not present

## 2021-01-20 MED ORDER — GENTAMICIN SULFATE 40 MG/ML IJ SOLN
80.0000 mg | Freq: Once | INTRAMUSCULAR | Status: AC
  Administered 2021-01-20: 80 mg via INTRAMUSCULAR

## 2021-01-20 MED ORDER — LEVOFLOXACIN 500 MG PO TABS
500.0000 mg | ORAL_TABLET | Freq: Once | ORAL | Status: AC
  Administered 2021-01-20: 500 mg via ORAL

## 2021-01-20 NOTE — Progress Notes (Signed)
Prostate Biopsy Procedure   Informed consent was obtained after discussing risks/benefits of the procedure.  A time out was performed to ensure correct patient identity.  Pre-Procedure: - Last PSA Level: 4.9 -CTU for microhematuria with asymmetric enhancement left prostate gland/seminal vesicle.  MRI with PI-RADS 5 lesion left PZ extending into seminal vesicle - Gentamicin given prophylactically - Levaquin 500 mg administered PO -Transrectal Ultrasound performed revealing a 61 gm prostate -Hypoechoic left PZ mid gland-base  Procedure: - Prostate block performed using 10 cc 1% lidocaine and biopsies taken from sextant areas, a total of 13 under ultrasound guidance with left biopsies including the hypoechoic areas.  An additional biopsy was taken of the left seminal vesicle  Post-Procedure: - Patient tolerated the procedure well - He was counseled to seek immediate medical attention if experiences any severe pain, significant bleeding, or fevers - Return in one week to discuss biopsy results   John Giovanni, MD

## 2021-01-24 LAB — SURGICAL PATHOLOGY

## 2021-01-26 ENCOUNTER — Encounter: Payer: Self-pay | Admitting: Urology

## 2021-01-26 ENCOUNTER — Other Ambulatory Visit: Payer: Self-pay

## 2021-01-26 ENCOUNTER — Ambulatory Visit: Payer: Medicare Other | Admitting: Urology

## 2021-01-26 VITALS — BP 169/102 | HR 80 | Ht 68.0 in | Wt 172.0 lb

## 2021-01-26 DIAGNOSIS — R972 Elevated prostate specific antigen [PSA]: Secondary | ICD-10-CM

## 2021-01-26 NOTE — Progress Notes (Signed)
   01/26/2021 9:06 AM   Michael Strickland [birth year] [id number]  Referring provider: Practice, [place],  [place] [address]  Chief Complaint  Patient presents with   Elevated PSA    HPI: 65 y.o. male presents for prostate biopsy follow-up.  CTU for microhematuria with asymmetric enhancement left prostate gland/seminal vesicle.  MRI with PI-RADS 5 lesion left PZ extending into seminal vesicle TRUS prostate volume 61 g; hypoechoic left PZ mid gland-base 13 core biopsy included several cores from the hypoechoic area and biopsy of left SV No postbiopsy problems All cores showed benign prostate tissue with 1 core left mid showing a focus of atypical glands   PMH: History reviewed. No pertinent past medical history.  Surgical History: Past Surgical History:  Procedure Laterality Date   INGUINAL HERNIA REPAIR Right    VASECTOMY      Home Medications:  Allergies as of 01/26/2021   No Known Allergies      Medication List        Accurate as of January 26, 2021  9:06 AM. If you have any questions, ask your nurse or doctor.          Biotin 5 MG Caps Take by mouth.   Garlic 0017 MG Caps Take by mouth.   latanoprost 0.005 % ophthalmic solution Commonly known as: XALATAN 1 drop at bedtime.   MENS 50+ MULTI VITAMIN/MIN PO Take by mouth.   MSM-GLUCOSAMINE PO Take by mouth.   [place] Salmon Oil 1000 MG Caps Take by mouth.   tamsulosin 0.4 MG Caps capsule Commonly known as: FLOMAX Take 1 capsule by mouth once daily   vitamin A 25000 UNIT capsule Take 25,000 Units by mouth daily.   Vitamin D3 100000 UNIT/GM Powd by Does not apply route.   vitamin E 200 UNIT capsule Take 200 Units by mouth daily.        Allergies: No Known Allergies  Family History: History reviewed. No pertinent family history.  Social History:  reports that he has been smoking cigarettes. He has never used smokeless tobacco. He reports  current alcohol use. No history on file for drug use.   Physical Exam: BP (!) 169/102   Pulse 80   Ht 5\' 8"  (1.727 m)   Wt 172 lb (78 kg)   BMI 26.15 kg/m   Constitutional:  Alert and oriented, No acute distress. HEENT: [place] AT, moist mucus membranes.  Trachea midline, no masses. Cardiovascular: No clubbing, cyanosis, or edema. Respiratory: Normal respiratory effort, no increased work of breathing. Psychiatric: Normal mood and affect.   Assessment & Plan:   65 y.o. male with mild PSA elevation and PI-RADS 5 lesion left prostate extending into the SV Excellent cores were obtained of the corresponding hypoechoic as well as the left SV with focus of atypia on 1 core from the left mid prostate We discussed the pathology report in detail and the finding of ASAP with recommendations of follow-up biopsy within 6-12 months Follow-up 6 months with repeat PSA and consider repeat MRI   Abbie Sons, MD  [facility] [address] [place], [place] [address] [phone]

## 2021-01-30 ENCOUNTER — Other Ambulatory Visit: Payer: Self-pay | Admitting: Physician Assistant

## 2021-01-30 DIAGNOSIS — N401 Enlarged prostate with lower urinary tract symptoms: Secondary | ICD-10-CM

## 2021-01-30 DIAGNOSIS — R3916 Straining to void: Secondary | ICD-10-CM

## 2021-05-23 ENCOUNTER — Other Ambulatory Visit: Payer: Medicare Other

## 2021-05-23 ENCOUNTER — Other Ambulatory Visit: Payer: Self-pay

## 2021-05-23 ENCOUNTER — Other Ambulatory Visit: Payer: Self-pay | Admitting: *Deleted

## 2021-05-23 DIAGNOSIS — R972 Elevated prostate specific antigen [PSA]: Secondary | ICD-10-CM

## 2021-05-24 LAB — PSA: Prostate Specific Ag, Serum: 8.1 ng/mL — ABNORMAL HIGH (ref 0.0–4.0)

## 2021-05-27 ENCOUNTER — Other Ambulatory Visit: Payer: Self-pay

## 2021-05-27 ENCOUNTER — Ambulatory Visit (INDEPENDENT_AMBULATORY_CARE_PROVIDER_SITE_OTHER): Payer: Medicare Other | Admitting: Urology

## 2021-05-27 ENCOUNTER — Encounter: Payer: Self-pay | Admitting: Urology

## 2021-05-27 VITALS — BP 130/78 | HR 68 | Ht 67.0 in | Wt 170.0 lb

## 2021-05-27 DIAGNOSIS — R310 Gross hematuria: Secondary | ICD-10-CM | POA: Diagnosis not present

## 2021-05-27 DIAGNOSIS — R972 Elevated prostate specific antigen [PSA]: Secondary | ICD-10-CM | POA: Diagnosis not present

## 2021-05-27 LAB — MICROSCOPIC EXAMINATION
Bacteria, UA: NONE SEEN
Epithelial Cells (non renal): NONE SEEN /hpf (ref 0–10)

## 2021-05-27 LAB — URINALYSIS, COMPLETE
Bilirubin, UA: NEGATIVE
Glucose, UA: NEGATIVE
Ketones, UA: NEGATIVE
Leukocytes,UA: NEGATIVE
Nitrite, UA: NEGATIVE
Protein,UA: NEGATIVE
Specific Gravity, UA: 1.02 (ref 1.005–1.030)
Urobilinogen, Ur: 0.2 mg/dL (ref 0.2–1.0)
pH, UA: 6 (ref 5.0–7.5)

## 2021-05-27 NOTE — Progress Notes (Addendum)
° °  05/27/2021 1:39 PM   Michael Strickland June 30, 1955 [id number]  Referring provider: Practice, [place],  [place] [address]  Chief Complaint  Patient presents with   Other    Urologic history: 1.  Elevated PSA Incidentally noted to have asymmetric enhancement left prostate/SV PSA was 4.9 Prostate MRI with PI-RADS 5 lesion left PZ extending into SV Biopsy performed 12/2020.  Left PZ diffusely hypoechoic however no cancer noted on biopsies including biopsies left seminal vesicle  HPI: 66 y.o. male presents for 52-month follow-up.  Stable LUTS with some improvement in urinary frequency on tamsulosin Repeat PSA 05/23/2021 was 8.1 Denies dysuria, gross hematuria No flank, abdominal or pelvic pain   PMH: History reviewed. No pertinent past medical history.  Surgical History: Past Surgical History:  Procedure Laterality Date   INGUINAL HERNIA REPAIR Right    VASECTOMY      Home Medications:  Allergies as of 05/27/2021   No Known Allergies      Medication List        Accurate as of May 27, 2021  1:39 PM. If you have any questions, ask your nurse or doctor.          Biotin 5 MG Caps Take by mouth.   Garlic 8938 MG Caps Take by mouth.   latanoprost 0.005 % ophthalmic solution Commonly known as: XALATAN 1 drop at bedtime.   MENS 50+ MULTI VITAMIN/MIN PO Take by mouth.   MSM-GLUCOSAMINE PO Take by mouth.   [place] Salmon Oil 1000 MG Caps Take by mouth.   tamsulosin 0.4 MG Caps capsule Commonly known as: FLOMAX Take 1 capsule by mouth once daily   vitamin A 25000 UNIT capsule Take 25,000 Units by mouth daily.   Vitamin D3 100000 UNIT/GM Powd by Does not apply route.   vitamin E 200 UNIT capsule Take 200 Units by mouth daily.        Allergies: No Known Allergies  Family History: History reviewed. No pertinent family history.  Social History:  reports that he has been smoking cigarettes. He has  never used smokeless tobacco. He reports current alcohol use. No history on file for drug use.   Physical Exam: BP 130/78    Pulse 68    Ht 5\' 7"  (1.702 m)    Wt 170 lb (77.1 kg)    BMI 26.63 kg/m   Constitutional:  Alert and oriented, No acute distress. HEENT: [place] AT, moist mucus membranes.  Trachea midline, no masses. Cardiovascular: No clubbing, cyanosis, or edema. Respiratory: Normal respiratory effort, no increased work of breathing. GU: Diffusely firm left prostate not previously identified Psychiatric: Normal mood and affect.   Assessment & Plan:    1.  Elevated PSA Significant PSA bump 8.1 Abnormal DRE not noted on prior exam Recommend repeat prostate biopsy.  Offered biopsy in OR under sedation however he would prefer to have done in office Based on DRE and prior TRUS do not feel fusion biopsy would increase diagnostic yield   Abbie Sons, MD  [facility] [address], [place], [place] [address] [phone]

## 2021-05-27 NOTE — Patient Instructions (Signed)

## 2021-05-28 ENCOUNTER — Encounter: Payer: Self-pay | Admitting: Urology

## 2021-06-08 ENCOUNTER — Encounter: Payer: Self-pay | Admitting: Urology

## 2021-06-17 ENCOUNTER — Ambulatory Visit: Payer: Medicare Other | Admitting: Urology

## 2021-06-17 ENCOUNTER — Encounter: Payer: Self-pay | Admitting: Urology

## 2021-06-17 ENCOUNTER — Other Ambulatory Visit: Payer: Self-pay

## 2021-06-17 VITALS — BP 127/86 | HR 80 | Ht 68.0 in | Wt 172.0 lb

## 2021-06-17 DIAGNOSIS — R972 Elevated prostate specific antigen [PSA]: Secondary | ICD-10-CM | POA: Diagnosis not present

## 2021-06-17 DIAGNOSIS — C61 Malignant neoplasm of prostate: Secondary | ICD-10-CM | POA: Diagnosis not present

## 2021-06-17 MED ORDER — LEVOFLOXACIN 500 MG PO TABS
500.0000 mg | ORAL_TABLET | Freq: Once | ORAL | Status: AC
  Administered 2021-06-17: 500 mg via ORAL

## 2021-06-17 MED ORDER — GENTAMICIN SULFATE 40 MG/ML IJ SOLN
80.0000 mg | Freq: Once | INTRAMUSCULAR | Status: AC
  Administered 2021-06-17: 80 mg via INTRAMUSCULAR

## 2021-06-17 NOTE — Progress Notes (Signed)
GIProstate Biopsy Procedure   Informed consent was obtained after discussing risks/benefits of the procedure.  A time out was performed to ensure correct patient identity.  Urologic history: 1.  Elevated PSA Incidentally noted to have asymmetric enhancement left prostate/SV on CTU for microhematuria PSA was 4.9 Prostate MRI with PI-RADS 5 lesion left PZ extending into SV Biopsy performed 12/2020.  Left PZ diffusely hypoechoic however no cancer noted on biopsies including biopsies left seminal vesicle PSA increased 8.1 05/27/2021 Firmness left prostate on DRE not previously noted   Pre-Procedure: - Last PSA Level: No results found for: PSA - Gentamicin given prophylactically - Levaquin 500 mg administered PO -Transrectal Ultrasound performed revealing a 62 gm prostate -Left PZ hypoechoic base to apex.  Enlargement left SV  Procedure: - Prostate block performed using 10 cc 1% lidocaine and biopsies taken from sextant areas, a total of 12 under ultrasound guidance.  Post-Procedure: - Patient tolerated the procedure well - He was counseled to seek immediate medical attention if experiences any severe pain, significant bleeding, or fevers - Return in one week to discuss biopsy results  John Giovanni, MD

## 2021-06-19 ENCOUNTER — Encounter: Payer: Self-pay | Admitting: Urology

## 2021-06-22 LAB — SURGICAL PATHOLOGY

## 2021-06-23 ENCOUNTER — Telehealth: Payer: Self-pay | Admitting: Urology

## 2021-06-23 NOTE — Telephone Encounter (Signed)
Called patient to discuss prostate biopsy results.  Left message on VM to contact office at his convenience ?

## 2021-06-24 ENCOUNTER — Other Ambulatory Visit: Payer: Self-pay

## 2021-06-24 ENCOUNTER — Encounter: Payer: Self-pay | Admitting: Urology

## 2021-06-24 ENCOUNTER — Ambulatory Visit: Payer: Medicare Other | Admitting: Urology

## 2021-06-24 VITALS — BP 161/92 | HR 58 | Ht 68.0 in | Wt 172.0 lb

## 2021-06-24 DIAGNOSIS — C61 Malignant neoplasm of prostate: Secondary | ICD-10-CM

## 2021-06-25 ENCOUNTER — Encounter: Payer: Self-pay | Admitting: Urology

## 2021-06-25 NOTE — Progress Notes (Signed)
? ?  06/24/2021 ?10:17 AM  ? ?Michael Strickland ?05-07-1955 ?[id number] ? ?Referring provider: Practice, [place] ?[place],  [place] [id number] ? ?Chief Complaint  ?Patient presents with  ? Results  ? ?Urologic history: ?1.  Elevated PSA ?Incidentally noted to have asymmetric enhancement left prostate/SV on CTU for microhematuria ?PSA was 4.9 ?Prostate MRI with PI-RADS 5 lesion left PZ extending into SV ?Biopsy performed 12/2020.  Left PZ diffusely hypoechoic however no cancer noted on biopsies including biopsies left seminal vesicle ?PSA increased 8.1 05/27/2021 ?Firmness left prostate on DRE not previously noted ? ? ?HPI: ?66 y.o. male presents for prostate biopsy results. ? ?History as above ?No postbiopsy complaints ?TRUS hypoechoic PZ base-apex with enlargement left SV ?Path: 6/6 L biopsies Gleason 4+4 adenocarcinoma ? ? ? ? ?PMH: ?No past medical history on file. ? ?Surgical History: ?Past Surgical History:  ?Procedure Laterality Date  ? INGUINAL HERNIA REPAIR Right   ? VASECTOMY    ? ? ?Home Medications:  ?Allergies as of 06/24/2021   ?No Known Allergies ?  ? ?  ?Medication List  ?  ? ?  ? Accurate as of June 24, 2021 11:59 PM. If you have any questions, ask your nurse or doctor.  ?  ?  ? ?  ? ?Biotin 5 MG Caps ?Take by mouth. ?  ?Garlic 0938 MG Caps ?Take by mouth. ?  ?latanoprost 0.005 % ophthalmic solution ?Commonly known as: XALATAN ?1 drop at bedtime. ?  ?MENS 50+ MULTI VITAMIN/MIN PO ?Take by mouth. ?  ?MSM-GLUCOSAMINE PO ?Take by mouth. ?  ?[place] Salmon Oil 1000 MG Caps ?Take by mouth. ?  ?tamsulosin 0.4 MG Caps capsule ?Commonly known as: FLOMAX ?Take 1 capsule by mouth once daily ?  ?vitamin A 25000 UNIT capsule ?Take 25,000 Units by mouth daily. ?  ?Vitamin D3 100000 UNIT/GM Powd ?by Does not apply route. ?  ?vitamin E 200 UNIT capsule ?Take 200 Units by mouth daily. ?  ? ?  ? ? ?Allergies: No Known Allergies ? ?Family History: ?No family history on file. ? ?Social History:   reports that he has been smoking cigarettes. He has never used smokeless tobacco. He reports current alcohol use. No history on file for drug use. ? ? ?Physical Exam: ?BP (!) 161/92   Pulse (!) 58   Ht '5\' 8"'$  (1.727 m)   Wt 172 lb (78 kg)   BMI 26.15 kg/m?   ?Constitutional:  Alert and oriented, No acute distress. ?Psychiatric: Normal mood and affect. ? ? ?Assessment & Plan:   ? ?1.  Clinical T3 adenocarcinoma prostate ?High risk disease ?Prior MRI suspicious for seminal vesicle invasion ?Recommend PSMA/PET for staging ?If no evidence of distant mets curative treatment options were discussed including radiation modalities and radical prostatectomy.  If evidence of seminal vesicle invasion the chances that radical prostatectomy would be curative are low and he would most likely need adjuvant radiation.  Side effects of each treatment were discussed ?Will contact patient after staging study and discuss treatment options further ? ?I spent 30 total minutes on the day of the encounter including pre-visit review of the medical record, face-to-face time with the patient, and post visit ordering of labs/imaging/tests. ? ? ?Abbie Sons, MD ? ?[facility] ?[address] ?[place], [place] [address] ?[phone] ? ?

## 2021-07-05 ENCOUNTER — Encounter: Payer: Self-pay | Admitting: Urology

## 2021-07-15 ENCOUNTER — Encounter: Payer: Self-pay | Admitting: Urology

## 2021-08-19 DIAGNOSIS — H40003 Preglaucoma, unspecified, bilateral: Secondary | ICD-10-CM | POA: Diagnosis not present

## 2021-08-23 DIAGNOSIS — R9431 Abnormal electrocardiogram [ECG] [EKG]: Secondary | ICD-10-CM | POA: Diagnosis not present

## 2021-08-23 DIAGNOSIS — Z79899 Other long term (current) drug therapy: Secondary | ICD-10-CM | POA: Diagnosis not present

## 2021-08-23 DIAGNOSIS — F17210 Nicotine dependence, cigarettes, uncomplicated: Secondary | ICD-10-CM | POA: Diagnosis not present

## 2021-08-23 DIAGNOSIS — G4733 Obstructive sleep apnea (adult) (pediatric): Secondary | ICD-10-CM | POA: Diagnosis not present

## 2021-08-23 DIAGNOSIS — Z01818 Encounter for other preprocedural examination: Secondary | ICD-10-CM | POA: Diagnosis not present

## 2021-08-31 DIAGNOSIS — G4733 Obstructive sleep apnea (adult) (pediatric): Secondary | ICD-10-CM | POA: Diagnosis not present

## 2021-08-31 DIAGNOSIS — C679 Malignant neoplasm of bladder, unspecified: Secondary | ICD-10-CM | POA: Diagnosis not present

## 2021-08-31 DIAGNOSIS — C675 Malignant neoplasm of bladder neck: Secondary | ICD-10-CM | POA: Diagnosis not present

## 2021-08-31 DIAGNOSIS — F17210 Nicotine dependence, cigarettes, uncomplicated: Secondary | ICD-10-CM | POA: Diagnosis not present

## 2021-08-31 DIAGNOSIS — Z79899 Other long term (current) drug therapy: Secondary | ICD-10-CM | POA: Diagnosis not present

## 2021-08-31 DIAGNOSIS — C7911 Secondary malignant neoplasm of bladder: Secondary | ICD-10-CM | POA: Diagnosis not present

## 2021-08-31 DIAGNOSIS — Z91199 Patient's noncompliance with other medical treatment and regimen due to unspecified reason: Secondary | ICD-10-CM | POA: Diagnosis not present

## 2021-08-31 DIAGNOSIS — Z720 Tobacco use: Secondary | ICD-10-CM | POA: Diagnosis not present

## 2021-09-13 DIAGNOSIS — M6281 Muscle weakness (generalized): Secondary | ICD-10-CM | POA: Diagnosis not present

## 2021-09-28 DIAGNOSIS — Z4889 Encounter for other specified surgical aftercare: Secondary | ICD-10-CM | POA: Diagnosis not present

## 2021-09-28 DIAGNOSIS — M6281 Muscle weakness (generalized): Secondary | ICD-10-CM | POA: Diagnosis not present

## 2021-10-27 DIAGNOSIS — M6281 Muscle weakness (generalized): Secondary | ICD-10-CM | POA: Diagnosis not present

## 2022-01-31 DIAGNOSIS — Z872 Personal history of diseases of the skin and subcutaneous tissue: Secondary | ICD-10-CM | POA: Diagnosis not present

## 2022-01-31 DIAGNOSIS — L578 Other skin changes due to chronic exposure to nonionizing radiation: Secondary | ICD-10-CM | POA: Diagnosis not present

## 2022-01-31 DIAGNOSIS — Z86018 Personal history of other benign neoplasm: Secondary | ICD-10-CM | POA: Diagnosis not present

## 2022-01-31 DIAGNOSIS — D485 Neoplasm of uncertain behavior of skin: Secondary | ICD-10-CM | POA: Diagnosis not present

## 2022-01-31 DIAGNOSIS — D225 Melanocytic nevi of trunk: Secondary | ICD-10-CM | POA: Diagnosis not present

## 2022-02-15 DIAGNOSIS — L988 Other specified disorders of the skin and subcutaneous tissue: Secondary | ICD-10-CM | POA: Diagnosis not present

## 2022-02-15 DIAGNOSIS — D235 Other benign neoplasm of skin of trunk: Secondary | ICD-10-CM | POA: Diagnosis not present

## 2022-06-21 IMAGING — CR DG ORBITS COMPLETE 4+V
1 series · 2 of 2 positions shown · non-contrast
Comparison: None

CLINICAL DATA: Metal exposure to eyes, for MRI clearance

EXAM:
ORBITS - COMPLETE 4+ VIEW

[Series 1: dg orbits · 0.14mm/px · 2 of 2 slices shown]
[im 1/2]
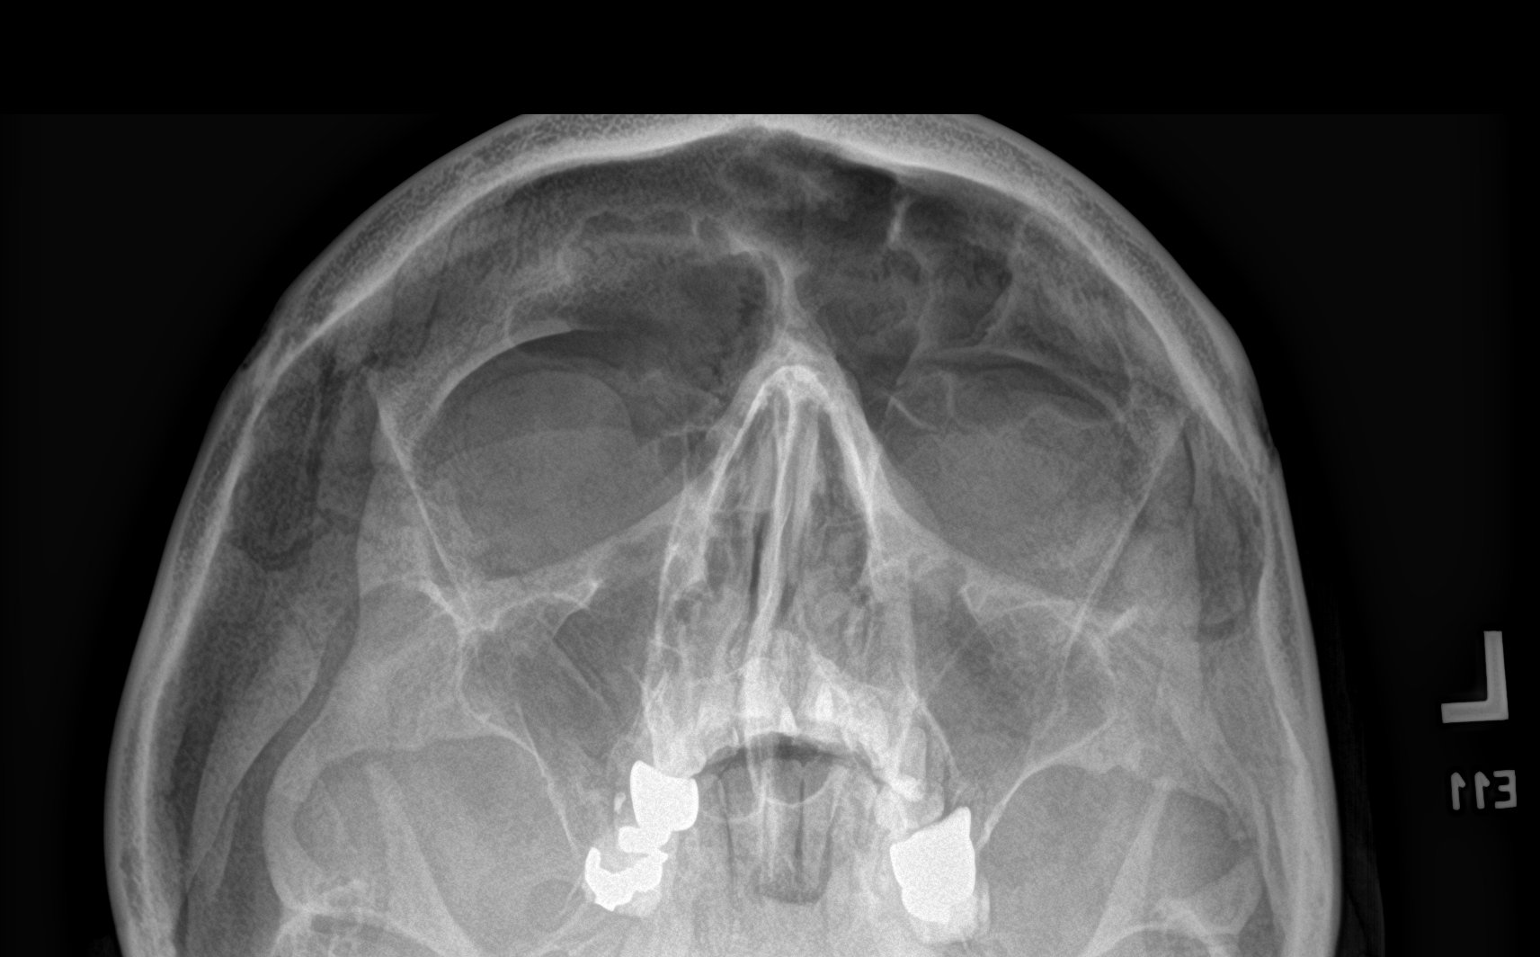
[im 2/2]
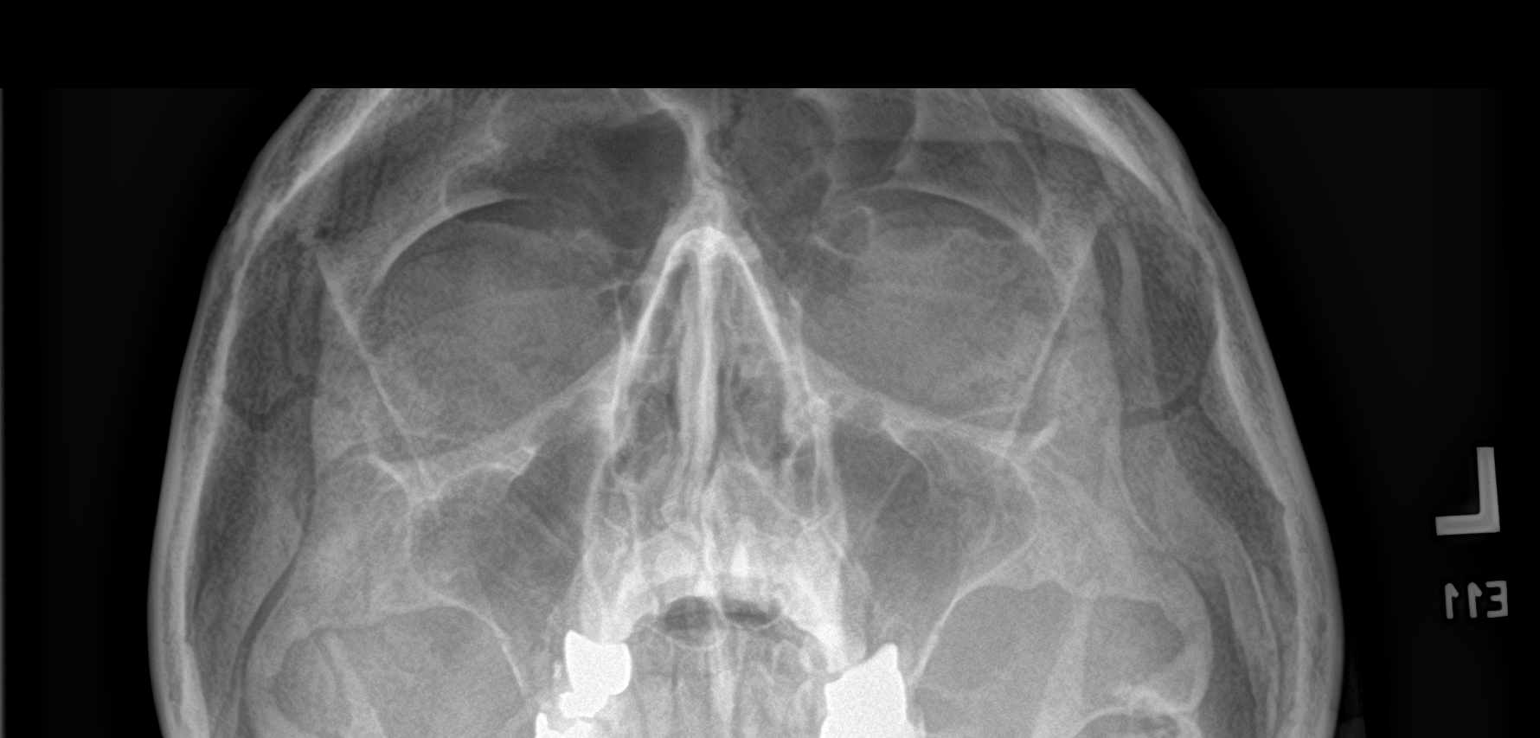

[2 of 2 positions shown; findings below may reference images not displayed]

FINDINGS: No orbital metallic foreign bodies identified.

Bones and sinuses unremarkable.
IMPRESSION: No orbital metallic foreign bodies.

## 2022-08-01 DIAGNOSIS — F17210 Nicotine dependence, cigarettes, uncomplicated: Secondary | ICD-10-CM | POA: Diagnosis not present

## 2022-08-01 DIAGNOSIS — Z23 Encounter for immunization: Secondary | ICD-10-CM | POA: Diagnosis not present

## 2022-08-01 DIAGNOSIS — Z923 Personal history of irradiation: Secondary | ICD-10-CM | POA: Diagnosis not present

## 2022-08-07 DIAGNOSIS — J439 Emphysema, unspecified: Secondary | ICD-10-CM | POA: Diagnosis not present

## 2022-08-07 DIAGNOSIS — E7800 Pure hypercholesterolemia, unspecified: Secondary | ICD-10-CM | POA: Diagnosis not present

## 2022-08-07 DIAGNOSIS — Z9181 History of falling: Secondary | ICD-10-CM | POA: Diagnosis not present

## 2022-08-07 DIAGNOSIS — Z139 Encounter for screening, unspecified: Secondary | ICD-10-CM | POA: Diagnosis not present

## 2022-08-07 DIAGNOSIS — I2510 Atherosclerotic heart disease of native coronary artery without angina pectoris: Secondary | ICD-10-CM | POA: Diagnosis not present

## 2022-08-07 DIAGNOSIS — I714 Abdominal aortic aneurysm, without rupture, unspecified: Secondary | ICD-10-CM | POA: Diagnosis not present

## 2022-08-07 DIAGNOSIS — Z720 Tobacco use: Secondary | ICD-10-CM | POA: Diagnosis not present

## 2022-08-07 DIAGNOSIS — Z23 Encounter for immunization: Secondary | ICD-10-CM | POA: Diagnosis not present

## 2022-08-17 DIAGNOSIS — L578 Other skin changes due to chronic exposure to nonionizing radiation: Secondary | ICD-10-CM | POA: Diagnosis not present

## 2022-08-24 DIAGNOSIS — H2513 Age-related nuclear cataract, bilateral: Secondary | ICD-10-CM | POA: Diagnosis not present

## 2022-08-24 DIAGNOSIS — H40003 Preglaucoma, unspecified, bilateral: Secondary | ICD-10-CM | POA: Diagnosis not present

## 2022-11-01 DIAGNOSIS — F17210 Nicotine dependence, cigarettes, uncomplicated: Secondary | ICD-10-CM | POA: Diagnosis not present

## 2022-11-07 ENCOUNTER — Emergency Department (HOSPITAL_COMMUNITY)
Admission: EM | Admit: 2022-11-07 | Discharge: 2022-11-07 | Disposition: A | Discharge status: Home / Self Care | Payer: Medicare Other | Attending: Emergency Medicine | Admitting: Emergency Medicine

## 2022-11-07 ENCOUNTER — Emergency Department (HOSPITAL_COMMUNITY): Payer: Medicare Other

## 2022-11-07 ENCOUNTER — Encounter (HOSPITAL_COMMUNITY): Payer: Self-pay

## 2022-11-07 ENCOUNTER — Other Ambulatory Visit: Payer: Self-pay

## 2022-11-07 DIAGNOSIS — R61 Generalized hyperhidrosis: Secondary | ICD-10-CM | POA: Diagnosis not present

## 2022-11-07 DIAGNOSIS — Z8546 Personal history of malignant neoplasm of prostate: Secondary | ICD-10-CM | POA: Diagnosis not present

## 2022-11-07 DIAGNOSIS — Z743 Need for continuous supervision: Secondary | ICD-10-CM | POA: Diagnosis not present

## 2022-11-07 DIAGNOSIS — R55 Syncope and collapse: Secondary | ICD-10-CM | POA: Diagnosis not present

## 2022-11-07 DIAGNOSIS — R404 Transient alteration of awareness: Secondary | ICD-10-CM | POA: Diagnosis not present

## 2022-11-07 DIAGNOSIS — R6889 Other general symptoms and signs: Secondary | ICD-10-CM | POA: Diagnosis not present

## 2022-11-07 DIAGNOSIS — R42 Dizziness and giddiness: Secondary | ICD-10-CM | POA: Diagnosis not present

## 2022-11-07 LAB — COMPREHENSIVE METABOLIC PANEL
ALT: 14 U/L (ref 0–44)
AST: 21 U/L (ref 15–41)
Albumin: 3.9 g/dL (ref 3.5–5.0)
Alkaline Phosphatase: 62 U/L (ref 38–126)
Anion gap: 14 (ref 5–15)
BUN: 19 mg/dL (ref 8–23)
CO2: 25 mmol/L (ref 22–32)
Calcium: 9.2 mg/dL (ref 8.9–10.3)
Chloride: 99 mmol/L (ref 98–111)
Creatinine, Ser: 1.18 mg/dL (ref 0.61–1.24)
GFR, Estimated: >60 mL/min (ref >60)
Glucose, Bld: 104 mg/dL — ABNORMAL HIGH (ref 70–99)
Potassium: 4.2 mmol/L (ref 3.5–5.1)
Sodium: 138 mmol/L (ref 135–145)
Total Bilirubin: 0.2 mg/dL — ABNORMAL LOW (ref 0.3–1.2)
Total Protein: 6.7 g/dL (ref 6.5–8.1)

## 2022-11-07 LAB — URINALYSIS, ROUTINE W REFLEX MICROSCOPIC
Bilirubin Urine: NEGATIVE
Glucose, UA: NEGATIVE mg/dL
Hgb urine dipstick: NEGATIVE
Ketones, ur: NEGATIVE mg/dL
Leukocytes,Ua: NEGATIVE
Nitrite: NEGATIVE
Protein, ur: NEGATIVE mg/dL
Specific Gravity, Urine: 1.011 (ref 1.005–1.030)
pH: 7 (ref 5.0–8.0)

## 2022-11-07 LAB — CBC WITH DIFFERENTIAL/PLATELET
Abs Immature Granulocytes: 0.04 10*3/uL (ref 0.00–0.07)
Basophils Absolute: 0 10*3/uL (ref 0.0–0.1)
Basophils Relative: 1 %
Eosinophils Absolute: 0.2 10*3/uL (ref 0.0–0.5)
Eosinophils Relative: 3 %
HCT: 40.9 % (ref 39.0–52.0)
Hemoglobin: 13.6 g/dL (ref 13.0–17.0)
Immature Granulocytes: 1 %
Lymphocytes Relative: 11 %
Lymphs Abs: 0.9 10*3/uL (ref 0.7–4.0)
MCH: 30.6 pg (ref 26.0–34.0)
MCHC: 33.3 g/dL (ref 30.0–36.0)
MCV: 92.1 fL (ref 80.0–100.0)
Monocytes Absolute: 0.6 10*3/uL (ref 0.1–1.0)
Monocytes Relative: 7 %
Neutro Abs: 6.7 10*3/uL (ref 1.7–7.7)
Neutrophils Relative %: 77 %
Platelets: 203 10*3/uL (ref 150–400)
RBC: 4.44 MIL/uL (ref 4.22–5.81)
RDW: 12.9 % (ref 11.5–15.5)
WBC: 8.5 10*3/uL (ref 4.0–10.5)
nRBC: 0 % (ref 0.0–0.2)

## 2022-11-07 LAB — TROPONIN I (HIGH SENSITIVITY)
Troponin I (High Sensitivity): 16 ng/L (ref <18)
Troponin I (High Sensitivity): 18 ng/L — ABNORMAL HIGH (ref <18)

## 2022-11-07 LAB — I-STAT CHEM 8, ED
BUN: 21 mg/dL (ref 8–23)
Calcium, Ion: 1.14 mmol/L — ABNORMAL LOW (ref 1.15–1.40)
Chloride: 103 mmol/L (ref 98–111)
Creatinine, Ser: 1.2 mg/dL (ref 0.61–1.24)
Glucose, Bld: 102 mg/dL — ABNORMAL HIGH (ref 70–99)
HCT: 40 % (ref 39.0–52.0)
Hemoglobin: 13.6 g/dL (ref 13.0–17.0)
Potassium: 4.2 mmol/L (ref 3.5–5.1)
Sodium: 139 mmol/L (ref 135–145)
TCO2: 26 mmol/L (ref 22–32)

## 2022-11-07 LAB — CBG MONITORING, ED: Glucose-Capillary: 89 mg/dL (ref 70–99)

## 2022-11-07 LAB — MAGNESIUM: Magnesium: 2 mg/dL (ref 1.7–2.4)

## 2022-11-07 MED ORDER — LACTATED RINGERS IV BOLUS
1000.0000 mL | Freq: Once | INTRAVENOUS | Status: AC
  Administered 2022-11-07: 1000 mL via INTRAVENOUS

## 2022-11-07 NOTE — ED Provider Notes (Signed)
[name] Provider Note   CSN: [id number] Arrival date & time: 11/07/22  1241     History  Chief Complaint  Patient presents with   Loss of Consciousness    Michael Strickland is a 67 y.o. male.  HPI Patient presents after syncopal episode.  Medical history includes prostate cancer, s/p radical prostatectomy and radiation.  Currently, he gets every 6 month injections of chemotherapy.  He has seen a cardiologist years ago, while living in [place].  After his PCP saw something on his EKG, he was sent for an echocardiogram.  He was told that his echocardiogram was normal.  He also underwent a stress test which he was told was normal.  He has not seen cardiology in the past several years.  He does currently live in the local area.  He was in his normal state of health earlier today.  While shopping at the [employer] store with his wife, he had a prodrome of feeling hot and dizzy.  His wife was able to help lower him to the ground.  He did have an LOC.  When he came to, he states that he felt good because the ground was nice and cool.  EMS was called to the scene.  When they arrived, he appeared pale and diaphoretic.  Vital signs were notable for bradycardia and hypotension.  This improved with position changes.  He received 250 cc IVF prior to arrival.  EMS noted normal sinus rhythm on monitor. Currently, patient denies any current symptoms.      Home Medications Prior to Admission medications   Medication Sig Start Date End Date Taking? Authorizing Provider  Beta Carotene (VITAMIN A) 25000 UNIT capsule Take 25,000 Units by mouth daily.    [provider]  Biotin 5 MG CAPS Take by mouth.    [provider]  Cholecalciferol (VITAMIN D3) 100000 UNIT/GM POWD by Does not apply route.    [provider]  Garlic 1000 MG CAPS Take by mouth.    [provider]  Glucosamine Sulfate-MSM (MSM-GLUCOSAMINE PO) Take by mouth.     [provider]  latanoprost (XALATAN) 0.005 % ophthalmic solution 1 drop at bedtime.    [provider]  Multiple Vitamins-Minerals (MENS 50+ MULTI VITAMIN/MIN PO) Take by mouth.    [provider]  Omega-3 Fatty Acids ([place] SALMON OIL) 1000 MG CAPS Take by mouth.    [provider]  tamsulosin (FLOMAX) 0.4 MG CAPS capsule Take 1 capsule by mouth once daily 02/01/21   Carman Ching, PA-C  vitamin E 200 UNIT capsule Take 200 Units by mouth daily.    [provider]      Allergies    Patient has no known allergies.    Review of Systems   Review of Systems  Constitutional:  Positive for diaphoresis.  Skin:  Positive for pallor.  Neurological:  Positive for dizziness, syncope and light-headedness.  All other systems reviewed and are negative.   Physical Exam Updated Vital Signs BP (!) 163/86   Pulse (!) 59   Temp 97.7 F (36.5 C) (Oral)   Resp 16   Ht 5\' 8"  (1.727 m)   Wt 74.4 kg   SpO2 94%   BMI 24.94 kg/m  Physical Exam Vitals and nursing note reviewed.  Constitutional:      General: He is not in acute distress.    Appearance: Normal appearance. He is well-developed. He is not ill-appearing, toxic-appearing or diaphoretic.  HENT:     Head: Normocephalic and atraumatic.     Right Ear: External ear normal.     Left Ear: External ear normal.     Nose: Nose normal.     Mouth/Throat:     Mouth: Mucous membranes are moist.  Eyes:     Extraocular Movements: Extraocular movements intact.     Conjunctiva/sclera: Conjunctivae normal.  Cardiovascular:     Rate and Rhythm: Normal rate and regular rhythm.     Heart sounds: No murmur heard. Pulmonary:     Effort: Pulmonary effort is normal. No respiratory distress.     Breath sounds: Normal breath sounds. No wheezing or rales.  Chest:     Chest wall: No tenderness.  Abdominal:     General: There is no distension.     Palpations: Abdomen is soft.     Tenderness:  There is no abdominal tenderness.  Musculoskeletal:        General: No swelling. Normal range of motion.     Cervical back: Normal range of motion and neck supple.     Right lower leg: No edema.     Left lower leg: No edema.  Skin:    General: Skin is warm and dry.     Capillary Refill: Capillary refill takes less than 2 seconds.     Coloration: Skin is not jaundiced or pale.  Neurological:     General: No focal deficit present.     Mental Status: He is alert and oriented to person, place, and time.     Cranial Nerves: No cranial nerve deficit.     Sensory: No sensory deficit.     Motor: No weakness.     Coordination: Coordination normal.  Psychiatric:        Mood and Affect: Mood normal.        Behavior: Behavior normal.        Thought Content: Thought content normal.        Judgment: Judgment normal.     ED Results / Procedures / Treatments   Labs (all labs ordered are listed, but only abnormal results are displayed) Labs Reviewed  COMPREHENSIVE METABOLIC PANEL - Abnormal; Notable for the following components:      Result Value   Glucose, Bld 104 (*)    Total Bilirubin 0.2 (*)    All other components within normal limits  I-STAT CHEM 8, ED - Abnormal; Notable for the following components:   Glucose, Bld 102 (*)    Calcium, Ion 1.14 (*)    All other components within normal limits  TROPONIN I (HIGH SENSITIVITY) - Abnormal; Notable for the following components:   Troponin I (High Sensitivity) 18 (*)    All other components within normal limits  CBC WITH DIFFERENTIAL/PLATELET  URINALYSIS, ROUTINE W REFLEX MICROSCOPIC  MAGNESIUM  CBG MONITORING, ED  TROPONIN I (HIGH SENSITIVITY)    EKG EKG Interpretation Date/Time:  Tuesday November 07 2022 12:53:51 EDT Ventricular Rate:  57 PR Interval:  36 QRS Duration:  103 QT Interval:  468 QTC Calculation: 456 R Axis:   -7  Text Interpretation: Sinus rhythm Short PR interval Abnormal R-wave progression, early transition LVH  with secondary repolarization abnormality Probable lateral infarct, age indeterminate Anterior Q waves, possibly due to LVH Confirmed by Gloris Manchester [phone]) on 11/07/2022 2:05:27 PM  Radiology No results found.  Procedures Procedures    Medications Ordered in ED Medications  lactated ringers bolus 1,000 mL (0 mLs Intravenous Stopped 11/07/22 1447)  ED Course/ Medical Decision Making/ A&P                             Medical Decision Making Amount and/or Complexity of Data Reviewed Labs: ordered. Radiology: ordered. ECG/medicine tests: ordered.   This patient presents to the ED for concern of syncope, this involves an extensive number of treatment options, and is a complaint that carries with it a high risk of complications and morbidity.  The differential diagnosis includes vasovagal episode, arrhythmia, dehydration, anemia, metabolic derangement, infection, seizure, TIA   Co morbidities that complicate the patient evaluation  Prostate cancer   Additional history obtained:  Additional history obtained from EMS, patient's wife External records from outside source obtained and reviewed including EMR   Lab Tests:  I Ordered, and personally interpreted labs.  The pertinent results include: Normal hemoglobin, no leukocytosis, normal electrolytes, normal troponins x 2   Imaging Studies ordered:  I ordered imaging studies including chest x-ray, CT head I independently visualized and interpreted imaging which showed no acute findings I agree with the radiologist interpretation   Cardiac Monitoring: / EKG:  The patient was maintained on a cardiac monitor.  I personally viewed and interpreted the cardiac monitored which showed an underlying rhythm of: Sinus rhythm  Problem List / ED Course / Critical interventions / Medication management  Patient presents after witnessed syncopal episode.  EMS noted bradycardia and hypotension on scene which resolved with position changes.   On arrival, patient is well-appearing.  He has no focal neurologic deficits.  He denies any current symptoms.  Vital signs are normal.  Patient was kept on bedside cardiac monitor.  Workup was initiated.  Additional IV fluids were ordered.  Patient's wife arrived at bedside and was able to provide further history.  She states that she does not think that he ever fully lost consciousness.  Lab results in the ED were unremarkable.  Patient remained asymptomatic.  He underwent imaging of head and chest which did not show acute findings.  He remained in normal sinus rhythm.  On trial of ambulation, vital signs remained normal and patient remained asymptomatic.  He was discharged in good condition. I ordered medication including IV fluids for hydration Reevaluation of the patient after these medicines showed that the patient resolved I have reviewed the patients home medicines and have made adjustments as needed   Social Determinants of Health:  Has access to outpatient care        Final Clinical Impression(s) / ED Diagnoses Final diagnoses:  Near syncope  Vasovagal episode    Rx / DC Orders ED Discharge Orders     None         Gloris Manchester, MD 11/07/22 1626

## 2022-11-07 NOTE — ED Notes (Signed)
Patient transported to CT 

## 2022-11-07 NOTE — Discharge Instructions (Signed)
Test results today are reassuring.  Maintain a normal diet and drink plenty of fluids to stay hydrated.  Return to the emergency department for any new or worsening symptoms of concern.

## 2022-11-07 NOTE — ED Triage Notes (Signed)
Pt BIB EMS due to syncopal event, pt felt lightheaded at [employer] and lowered on ground. Pt pale, diaphoretic. BP 94/50. VSS. Axox4. [facility] gave 250cc of NS

## 2022-11-17 ENCOUNTER — Telehealth: Payer: Self-pay | Admitting: *Deleted

## 2022-11-17 NOTE — Telephone Encounter (Signed)
Transition Care Management Unsuccessful Follow-up Telephone Call  Date of discharge and from where:  [facility] ed /11/07/2022  Attempts:  1st Attempt  Reason for unsuccessful TCM follow-up call:  Left voice message

## 2022-11-20 ENCOUNTER — Telehealth: Payer: Self-pay | Admitting: *Deleted

## 2022-11-20 NOTE — Telephone Encounter (Signed)
Transition Care Management Unsuccessful Follow-up Telephone Call  Date of discharge and from where:  [facility] ed 11/07/2022  Attempts:  2nd Attempt  Reason for unsuccessful TCM follow-up call:  Left voice message

## 2023-02-07 DIAGNOSIS — C675 Malignant neoplasm of bladder neck: Secondary | ICD-10-CM | POA: Diagnosis not present

## 2023-02-07 DIAGNOSIS — F17210 Nicotine dependence, cigarettes, uncomplicated: Secondary | ICD-10-CM | POA: Diagnosis not present

## 2023-02-07 DIAGNOSIS — Z803 Family history of malignant neoplasm of breast: Secondary | ICD-10-CM | POA: Diagnosis not present

## 2023-03-30 DIAGNOSIS — D485 Neoplasm of uncertain behavior of skin: Secondary | ICD-10-CM | POA: Diagnosis not present

## 2023-03-30 DIAGNOSIS — C44629 Squamous cell carcinoma of skin of left upper limb, including shoulder: Secondary | ICD-10-CM | POA: Diagnosis not present

## 2023-05-09 DIAGNOSIS — C44629 Squamous cell carcinoma of skin of left upper limb, including shoulder: Secondary | ICD-10-CM | POA: Diagnosis not present

## 2023-08-02 DIAGNOSIS — Z5111 Encounter for antineoplastic chemotherapy: Secondary | ICD-10-CM | POA: Diagnosis not present

## 2023-08-02 DIAGNOSIS — F17210 Nicotine dependence, cigarettes, uncomplicated: Secondary | ICD-10-CM | POA: Diagnosis not present

## 2023-08-15 DIAGNOSIS — E7800 Pure hypercholesterolemia, unspecified: Secondary | ICD-10-CM | POA: Diagnosis not present

## 2023-08-15 DIAGNOSIS — I2510 Atherosclerotic heart disease of native coronary artery without angina pectoris: Secondary | ICD-10-CM | POA: Diagnosis not present

## 2023-08-15 DIAGNOSIS — J439 Emphysema, unspecified: Secondary | ICD-10-CM | POA: Diagnosis not present

## 2023-08-15 DIAGNOSIS — Z139 Encounter for screening, unspecified: Secondary | ICD-10-CM | POA: Diagnosis not present

## 2023-08-15 DIAGNOSIS — Z1211 Encounter for screening for malignant neoplasm of colon: Secondary | ICD-10-CM | POA: Diagnosis not present

## 2023-08-15 DIAGNOSIS — Z9181 History of falling: Secondary | ICD-10-CM | POA: Diagnosis not present

## 2023-08-15 DIAGNOSIS — I10 Essential (primary) hypertension: Secondary | ICD-10-CM | POA: Diagnosis not present

## 2023-08-15 DIAGNOSIS — I714 Abdominal aortic aneurysm, without rupture, unspecified: Secondary | ICD-10-CM | POA: Diagnosis not present

## 2023-08-15 DIAGNOSIS — Z87891 Personal history of nicotine dependence: Secondary | ICD-10-CM | POA: Diagnosis not present

## 2023-08-20 DIAGNOSIS — Z86018 Personal history of other benign neoplasm: Secondary | ICD-10-CM | POA: Diagnosis not present

## 2023-08-20 DIAGNOSIS — Z872 Personal history of diseases of the skin and subcutaneous tissue: Secondary | ICD-10-CM | POA: Diagnosis not present

## 2023-08-20 DIAGNOSIS — L578 Other skin changes due to chronic exposure to nonionizing radiation: Secondary | ICD-10-CM | POA: Diagnosis not present

## 2023-08-20 DIAGNOSIS — Z859 Personal history of malignant neoplasm, unspecified: Secondary | ICD-10-CM | POA: Diagnosis not present

## 2023-08-21 DIAGNOSIS — Z1211 Encounter for screening for malignant neoplasm of colon: Secondary | ICD-10-CM | POA: Diagnosis not present

## 2023-08-21 DIAGNOSIS — Z1212 Encounter for screening for malignant neoplasm of rectum: Secondary | ICD-10-CM | POA: Diagnosis not present

## 2023-08-25 ENCOUNTER — Encounter (HOSPITAL_COMMUNITY): Payer: Self-pay

## 2023-08-25 ENCOUNTER — Inpatient Hospital Stay: Admit: 2023-08-25 | Admitting: Internal Medicine

## 2023-08-25 DIAGNOSIS — F17200 Nicotine dependence, unspecified, uncomplicated: Secondary | ICD-10-CM | POA: Diagnosis not present

## 2023-08-25 DIAGNOSIS — R7989 Other specified abnormal findings of blood chemistry: Secondary | ICD-10-CM | POA: Diagnosis not present

## 2023-08-25 DIAGNOSIS — I10 Essential (primary) hypertension: Secondary | ICD-10-CM | POA: Diagnosis not present

## 2023-08-25 DIAGNOSIS — Z79899 Other long term (current) drug therapy: Secondary | ICD-10-CM | POA: Diagnosis not present

## 2023-08-25 DIAGNOSIS — R079 Chest pain, unspecified: Secondary | ICD-10-CM | POA: Diagnosis not present

## 2023-08-25 DIAGNOSIS — I4891 Unspecified atrial fibrillation: Secondary | ICD-10-CM | POA: Diagnosis not present

## 2023-08-25 DIAGNOSIS — Z7901 Long term (current) use of anticoagulants: Secondary | ICD-10-CM | POA: Diagnosis not present

## 2023-08-25 DIAGNOSIS — R42 Dizziness and giddiness: Secondary | ICD-10-CM | POA: Diagnosis not present

## 2023-08-25 DIAGNOSIS — I719 Aortic aneurysm of unspecified site, without rupture: Secondary | ICD-10-CM | POA: Diagnosis not present

## 2023-08-25 NOTE — Progress Notes (Deleted)
 Patient with Hx of HTN, cigarette smoking presents [facility] with New Onset of A-Fib. Denies Any Chest Pains.  ED Workup Showed new onset of afib with RVR, uptrending Troponins 0.28> 0.34, EKG showed no acute ST changes.  Heart rate was initially in the 140s, responded to IV Lopressor and patient is now on metoprolol 25 mg twice daily.    [facility] initially planned to transfer patient to tertiary center for cardiac workup, patient says that he prefers to  come to [facility] as he lives in [place] and follows with PCP at [facility] before. Dr. Beau Bound was contacted and recommended patient come to [facility] for ischemia workup.  Now heart rate is in the 70-80s, and repeat EKG showed A-fib and SBP 130-140s.  Not hypoxic and chest x-ray showed no signs of CHF.  Accepted to PCU.

## 2023-08-26 DIAGNOSIS — I4891 Unspecified atrial fibrillation: Secondary | ICD-10-CM | POA: Diagnosis not present

## 2023-08-27 DIAGNOSIS — I10 Essential (primary) hypertension: Secondary | ICD-10-CM | POA: Diagnosis not present

## 2023-08-27 DIAGNOSIS — I519 Heart disease, unspecified: Secondary | ICD-10-CM | POA: Diagnosis not present

## 2023-08-27 DIAGNOSIS — R7989 Other specified abnormal findings of blood chemistry: Secondary | ICD-10-CM | POA: Diagnosis not present

## 2023-08-27 DIAGNOSIS — I517 Cardiomegaly: Secondary | ICD-10-CM | POA: Diagnosis not present

## 2023-08-27 DIAGNOSIS — I4891 Unspecified atrial fibrillation: Secondary | ICD-10-CM | POA: Diagnosis not present

## 2023-08-28 DIAGNOSIS — I4891 Unspecified atrial fibrillation: Secondary | ICD-10-CM | POA: Diagnosis not present

## 2023-08-29 DIAGNOSIS — I714 Abdominal aortic aneurysm, without rupture, unspecified: Secondary | ICD-10-CM | POA: Diagnosis not present

## 2023-08-29 DIAGNOSIS — Z122 Encounter for screening for malignant neoplasm of respiratory organs: Secondary | ICD-10-CM | POA: Diagnosis not present

## 2023-08-29 DIAGNOSIS — Z87891 Personal history of nicotine dependence: Secondary | ICD-10-CM | POA: Diagnosis not present

## 2023-08-30 DIAGNOSIS — H2513 Age-related nuclear cataract, bilateral: Secondary | ICD-10-CM | POA: Diagnosis not present

## 2023-08-30 DIAGNOSIS — Z87891 Personal history of nicotine dependence: Secondary | ICD-10-CM | POA: Diagnosis not present

## 2023-08-30 DIAGNOSIS — H40003 Preglaucoma, unspecified, bilateral: Secondary | ICD-10-CM | POA: Diagnosis not present

## 2023-08-30 DIAGNOSIS — I77811 Abdominal aortic ectasia: Secondary | ICD-10-CM | POA: Diagnosis not present

## 2023-09-10 DIAGNOSIS — I480 Paroxysmal atrial fibrillation: Secondary | ICD-10-CM | POA: Diagnosis not present

## 2023-09-10 DIAGNOSIS — I714 Abdominal aortic aneurysm, without rupture, unspecified: Secondary | ICD-10-CM | POA: Diagnosis not present

## 2023-09-10 DIAGNOSIS — I10 Essential (primary) hypertension: Secondary | ICD-10-CM | POA: Diagnosis not present

## 2023-09-10 DIAGNOSIS — Z79899 Other long term (current) drug therapy: Secondary | ICD-10-CM | POA: Diagnosis not present

## 2023-10-01 DIAGNOSIS — I4891 Unspecified atrial fibrillation: Secondary | ICD-10-CM | POA: Diagnosis not present

## 2023-10-01 DIAGNOSIS — R7989 Other specified abnormal findings of blood chemistry: Secondary | ICD-10-CM | POA: Diagnosis not present

## 2023-11-12 DIAGNOSIS — I2510 Atherosclerotic heart disease of native coronary artery without angina pectoris: Secondary | ICD-10-CM | POA: Diagnosis not present

## 2023-11-12 DIAGNOSIS — I10 Essential (primary) hypertension: Secondary | ICD-10-CM | POA: Diagnosis not present

## 2023-11-12 DIAGNOSIS — I480 Paroxysmal atrial fibrillation: Secondary | ICD-10-CM | POA: Diagnosis not present

## 2023-11-12 DIAGNOSIS — E7800 Pure hypercholesterolemia, unspecified: Secondary | ICD-10-CM | POA: Diagnosis not present

## 2024-02-05 DIAGNOSIS — I480 Paroxysmal atrial fibrillation: Secondary | ICD-10-CM | POA: Diagnosis not present

## 2024-02-05 DIAGNOSIS — I10 Essential (primary) hypertension: Secondary | ICD-10-CM | POA: Diagnosis not present

## 2024-02-05 DIAGNOSIS — Z7901 Long term (current) use of anticoagulants: Secondary | ICD-10-CM | POA: Diagnosis not present

## 2024-02-05 DIAGNOSIS — E785 Hyperlipidemia, unspecified: Secondary | ICD-10-CM | POA: Diagnosis not present

## 2024-02-05 DIAGNOSIS — R7989 Other specified abnormal findings of blood chemistry: Secondary | ICD-10-CM | POA: Diagnosis not present
